# Patient Record
Sex: Female | Born: 1990
Health system: Southern US, Community
[De-identification: ages and names within clinical notes are randomized; demographics above are authoritative.]

## PROBLEM LIST (undated history)

## (undated) ENCOUNTER — Inpatient Hospital Stay: Payer: Self-pay

## (undated) ENCOUNTER — Inpatient Hospital Stay (HOSPITAL_COMMUNITY): Payer: Self-pay

## (undated) DIAGNOSIS — Z789 Other specified health status: Secondary | ICD-10-CM

## (undated) HISTORY — PX: NO PAST SURGERIES: SHX2092

## (undated) HISTORY — PX: WISDOM TOOTH EXTRACTION: SHX21

---
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
## 2011-06-09 ENCOUNTER — Emergency Department (HOSPITAL_BASED_OUTPATIENT_CLINIC_OR_DEPARTMENT_OTHER)
Admission: EM | Admit: 2011-06-09 | Discharge: 2011-06-09 | Disposition: A | Discharge status: Home / Self Care | Payer: Self-pay | Attending: Emergency Medicine | Admitting: Emergency Medicine

## 2011-06-09 ENCOUNTER — Encounter (HOSPITAL_BASED_OUTPATIENT_CLINIC_OR_DEPARTMENT_OTHER): Payer: Self-pay | Admitting: *Deleted

## 2011-06-09 DIAGNOSIS — K0889 Other specified disorders of teeth and supporting structures: Secondary | ICD-10-CM

## 2011-06-09 DIAGNOSIS — K089 Disorder of teeth and supporting structures, unspecified: Secondary | ICD-10-CM | POA: Insufficient documentation

## 2011-06-09 MED ORDER — PENICILLIN V POTASSIUM 500 MG PO TABS: 500.0000 mg | ORAL_TABLET | Freq: Four times a day (QID) | ORAL | Status: AC

## 2011-06-09 MED ORDER — HYDROCODONE-ACETAMINOPHEN 5-325 MG PO TABS: 2.0000 | ORAL_TABLET | ORAL | Status: AC | PRN

## 2011-06-09 NOTE — ED Notes (Signed)
C/o pain to left side of face and mouth x 79month-denies injury

## 2011-06-09 NOTE — ED Provider Notes (Signed)
History     CSN: [id number]  Arrival date & time 06/09/11  1204   First MD Initiated Contact with Patient 06/09/11 1348      Chief Complaint  Patient presents with  . Dental Pain  . Headache    (Consider location/radiation/quality/duration/timing/severity/associated sxs/prior treatment) Patient is a 21 y.o. female presenting with tooth pain. The history is provided by the patient. No language interpreter was used.  Dental PainThe symptoms began more than 1 month ago. The symptoms are worsening. The symptoms are new. The symptoms occur constantly.  Pt complains of pain in her mouth and in the left side of her face.  Pt thinks pain is from a tooth.  Pt has not seen her dentist.  Pt complains of pain in side of face from ear to neck.  History reviewed. No pertinent past medical history.  History reviewed. No pertinent past surgical history.  No family history on file.  History  Substance Use Topics  . Smoking status: Never Smoker   . Smokeless tobacco: Not on file  . Alcohol Use: No    OB History    Grav Para Term Preterm Abortions TAB SAB Ect Mult Living                  Review of Systems  HENT: Positive for dental problem.   All other systems reviewed and are negative.    Allergies  Review of patient's allergies indicates no known allergies.  Home Medications   Current Outpatient Rx  Name Route Sig Dispense Refill  . HYDROCODONE-ACETAMINOPHEN 5-325 MG PO TABS Oral Take 2 tablets by mouth every 4 (four) hours as needed for pain. 10 tablet 0  . PENICILLIN V POTASSIUM 500 MG PO TABS Oral Take 1 tablet (500 mg total) by mouth 4 (four) times daily. 40 tablet 0    BP 121/73  Pulse 95  Temp(Src) 98.1 F (36.7 C) (Oral)  Resp 16  Ht 5\' 5"  (1.651 m)  Wt 137 lb (62.143 kg)  BMI 22.80 kg/m2  SpO2 100%  Physical Exam  Nursing note and vitals reviewed. Constitutional: She is oriented to person, place, and time. She appears well-developed and well-nourished.    HENT:  Head: Normocephalic and atraumatic.  Eyes: Conjunctivae and EOM are normal. Pupils are equal, round, and reactive to light.  Neck: Normal range of motion. Neck supple.  Cardiovascular: Normal rate.   Pulmonary/Chest: Effort normal.  Abdominal: Soft.  Musculoskeletal: Normal range of motion.  Neurological: She is alert and oriented to person, place, and time.  Skin: Skin is warm.  Psychiatric: She has a normal mood and affect.    ED Course  Procedures (including critical care time)  Labs Reviewed - No data to display No results found.   1. Tooth pain       MDM  Pt counseled I will put on pcn.  I advised pt she needs to have dental xrays.  Pt advised to call her dentist to be seen for evaluationj    Medical screening examination/treatment/procedure(s) were performed by non-physician practitioner and as supervising physician I was immediately available for consultation/collaboration. Osvaldo Human, M.D.    Lonia Skinner [place], [place] 06/09/11 1843  Carleene Cooper III, MD 06/09/11 2117

## 2011-06-09 NOTE — Discharge Instructions (Signed)
Dental Pain A tooth ache may be caused by cavities (tooth decay). Cavities expose the nerve of the tooth to air and hot or cold temperatures. It may come from an infection or abscess (also called a boil or furuncle) around your tooth. It is also often caused by dental caries (tooth decay). This causes the pain you are having. DIAGNOSIS  Your caregiver can diagnose this problem by exam. TREATMENT   If caused by an infection, it may be treated with medications which kill germs (antibiotics) and pain medications as prescribed by your caregiver. Take medications as directed.   Only take over-the-counter or prescription medicines for pain, discomfort, or fever as directed by your caregiver.   Whether the tooth ache today is caused by infection or dental disease, you should see your dentist as soon as possible for further care.  SEEK MEDICAL CARE IF: The exam and treatment you received today has been provided on an emergency basis only. This is not a substitute for complete medical or dental care. If your problem worsens or new problems (symptoms) appear, and you are unable to meet with your dentist, call or return to this location. SEEK IMMEDIATE MEDICAL CARE IF:   You have a fever.   You develop redness and swelling of your face, jaw, or neck.   You are unable to open your mouth.   You have severe pain uncontrolled by pain medicine.  MAKE SURE YOU:   Understand these instructions.   Will watch your condition.   Will get help right away if you are not doing well or get worse.  Document Released: 03/16/2005 Document Revised: 03/05/2011 Document Reviewed: 11/02/2007 [facility] Patient Information 2012 [place], [place].Dental Pain A tooth ache may be caused by cavities (tooth decay). Cavities expose the nerve of the tooth to air and hot or cold temperatures. It may come from an infection or abscess (also called a boil or furuncle) around your tooth. It is also often caused by dental caries (tooth  decay). This causes the pain you are having. DIAGNOSIS  Your caregiver can diagnose this problem by exam. TREATMENT   If caused by an infection, it may be treated with medications which kill germs (antibiotics) and pain medications as prescribed by your caregiver. Take medications as directed.   Only take over-the-counter or prescription medicines for pain, discomfort, or fever as directed by your caregiver.   Whether the tooth ache today is caused by infection or dental disease, you should see your dentist as soon as possible for further care.  SEEK MEDICAL CARE IF: The exam and treatment you received today has been provided on an emergency basis only. This is not a substitute for complete medical or dental care. If your problem worsens or new problems (symptoms) appear, and you are unable to meet with your dentist, call or return to this location. SEEK IMMEDIATE MEDICAL CARE IF:   You have a fever.   You develop redness and swelling of your face, jaw, or neck.   You are unable to open your mouth.   You have severe pain uncontrolled by pain medicine.  MAKE SURE YOU:   Understand these instructions.   Will watch your condition.   Will get help right away if you are not doing well or get worse.  Document Released: 03/16/2005 Document Revised: 03/05/2011 Document Reviewed: 11/02/2007 [facility] Patient Information 2012 [place], [place].

## 2015-11-02 ENCOUNTER — Encounter (HOSPITAL_BASED_OUTPATIENT_CLINIC_OR_DEPARTMENT_OTHER): Payer: Self-pay | Admitting: *Deleted

## 2015-11-02 ENCOUNTER — Emergency Department (HOSPITAL_BASED_OUTPATIENT_CLINIC_OR_DEPARTMENT_OTHER)
Admission: EM | Admit: 2015-11-02 | Discharge: 2015-11-02 | Disposition: A | Discharge status: Home / Self Care | Payer: Medicaid Other | Attending: Emergency Medicine | Admitting: Emergency Medicine

## 2015-11-02 DIAGNOSIS — L03211 Cellulitis of face: Secondary | ICD-10-CM | POA: Diagnosis not present

## 2015-11-02 DIAGNOSIS — R220 Localized swelling, mass and lump, head: Secondary | ICD-10-CM | POA: Diagnosis present

## 2015-11-02 DIAGNOSIS — Z79899 Other long term (current) drug therapy: Secondary | ICD-10-CM | POA: Insufficient documentation

## 2015-11-02 MED ORDER — CLINDAMYCIN HCL 300 MG PO CAPS: 300.0000 mg | ORAL_CAPSULE | Freq: Four times a day (QID) | ORAL | 0 refills | Status: DC

## 2015-11-02 NOTE — ED Provider Notes (Signed)
[facility]   CSN: [id number] Arrival date & time: 11/02/15  0820  First Provider Contact:  First MD Initiated Contact with Patient 11/02/15 [phone]        History   Chief Complaint Chief Complaint  Patient presents with  . Oral Swelling    HPI Lorraine Stevens is a 25 y.o. female.  Patient presents with a 2 day history of swelling to her upper lip. She stated it started like a little stinging and she felt like there is a little bump there and through yesterday, increasingly swollen and tender. She denies any fevers. No tooth pain. No shortness of breath. No rash or itching. No history of abscesses in the past.      History reviewed. No pertinent past medical history.  There are no active problems to display for this patient.   History reviewed. No pertinent surgical history.  OB History    Gravida Para Term Preterm AB Living   1             SAB TAB Ectopic Multiple Live Births                   Home Medications    Prior to Admission medications   Medication Sig Start Date End Date Taking? Authorizing Provider  PRESCRIPTION MEDICATION Birth Control Pills   Yes Historical Provider, MD  clindamycin (CLEOCIN) 300 MG capsule Take 1 capsule (300 mg total) by mouth 4 (four) times daily. X 7 days 11/02/15   Rolan Bucco, MD    Family History No family history on file.  Social History Social History  Substance Use Topics  . Smoking status: Never Smoker  . Smokeless tobacco: Never Used  . Alcohol use No     Allergies   Review of patient's allergies indicates no known allergies.   Review of Systems Review of Systems  Constitutional: Negative for fever.  HENT: Positive for facial swelling.   Respiratory: Negative for shortness of breath.   Gastrointestinal: Negative for nausea and vomiting.  Skin: Negative for rash.  Allergic/Immunologic: Negative for environmental allergies and food allergies.  Neurological: Negative for  light-headedness.     Physical Exam Updated Vital Signs BP 129/81 (BP Location: Right Arm)   Pulse 71   Temp 99.2 F (37.3 C) (Oral)   Resp 18   Ht 5\' 6"  (1.676 m)   Wt 132 lb (59.9 kg)   LMP 10/16/2015   SpO2 99%   BMI 21.31 kg/m   Physical Exam  Constitutional: She is oriented to person, place, and time. She appears well-developed and well-nourished.  HENT:  Patient has a small less than 1 cm area of swelling to her upper lip. It is tender. There is no adjacent tooth tenderness or evident infection. I don't feel any fluctuant abscess. There is no surrounding facial swelling. No rash. No vesicles.  Cardiovascular: Normal rate.   Pulmonary/Chest: Effort normal.  Neurological: She is alert and oriented to person, place, and time.  Skin: Skin is warm and dry. No rash noted.  Vitals reviewed.    ED Treatments / Results  Labs (all labs ordered are listed, but only abnormal results are displayed) Labs Reviewed - No data to display  EKG  EKG Interpretation None       Radiology No results found.  Procedures Procedures (including critical care time)  Medications Ordered in ED Medications - No data to display   Initial Impression / Assessment and Plan / ED Course  I have reviewed the triage vital signs and the nursing notes.  Pertinent labs & imaging results that were available during my care of the patient were reviewed by me and considered in my medical decision making (see chart for details).  Clinical Course    Patient with a localized infection to her upper lip. I don't feel a drainable abscess. There is no evidence of allergic reaction or systemic signs of infection. She was discharged home in good condition. She was given a prescription for clindamycin. She was advised to use warm compresses to the area. She was advised return if she has any worsening symptoms or increased swelling.  Final Clinical Impressions(s) / ED Diagnoses   Final diagnoses:  Facial  cellulitis    New Prescriptions New Prescriptions   CLINDAMYCIN (CLEOCIN) 300 MG CAPSULE    Take 1 capsule (300 mg total) by mouth 4 (four) times daily. X 7 days     Rolan Bucco, MD 11/02/15 [phone]

## 2015-11-02 NOTE — ED Triage Notes (Signed)
Patient states she was watching on her cough watching tv two nights ago and had a sudden swelling of her upper lip.  Has taken benadryl yesterday with no relief.

## 2015-12-18 ENCOUNTER — Emergency Department (HOSPITAL_BASED_OUTPATIENT_CLINIC_OR_DEPARTMENT_OTHER)
Admission: EM | Admit: 2015-12-18 | Discharge: 2015-12-18 | Disposition: A | Discharge status: Home / Self Care | Payer: Medicaid Other | Attending: Emergency Medicine | Admitting: Emergency Medicine

## 2015-12-18 ENCOUNTER — Encounter (HOSPITAL_BASED_OUTPATIENT_CLINIC_OR_DEPARTMENT_OTHER): Payer: Self-pay

## 2015-12-18 DIAGNOSIS — Y92410 Unspecified street and highway as the place of occurrence of the external cause: Secondary | ICD-10-CM | POA: Diagnosis not present

## 2015-12-18 DIAGNOSIS — M791 Myalgia: Secondary | ICD-10-CM | POA: Diagnosis not present

## 2015-12-18 DIAGNOSIS — R51 Headache: Secondary | ICD-10-CM | POA: Diagnosis not present

## 2015-12-18 DIAGNOSIS — M7918 Myalgia, other site: Secondary | ICD-10-CM

## 2015-12-18 DIAGNOSIS — Y9389 Activity, other specified: Secondary | ICD-10-CM | POA: Insufficient documentation

## 2015-12-18 DIAGNOSIS — M542 Cervicalgia: Secondary | ICD-10-CM | POA: Diagnosis present

## 2015-12-18 DIAGNOSIS — Y999 Unspecified external cause status: Secondary | ICD-10-CM | POA: Diagnosis not present

## 2015-12-18 MED ORDER — METHOCARBAMOL 500 MG PO TABS: 1000.0000 mg | ORAL_TABLET | Freq: Four times a day (QID) | ORAL | 0 refills | Status: DC
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
## 2016-04-21 ENCOUNTER — Emergency Department (HOSPITAL_BASED_OUTPATIENT_CLINIC_OR_DEPARTMENT_OTHER): Payer: Medicaid Other

## 2016-04-21 ENCOUNTER — Encounter (HOSPITAL_BASED_OUTPATIENT_CLINIC_OR_DEPARTMENT_OTHER): Payer: Self-pay

## 2016-04-21 ENCOUNTER — Emergency Department (HOSPITAL_BASED_OUTPATIENT_CLINIC_OR_DEPARTMENT_OTHER)
Admission: EM | Admit: 2016-04-21 | Discharge: 2016-04-21 | Disposition: A | Discharge status: Home / Self Care | Payer: Medicaid Other | Attending: Emergency Medicine | Admitting: Emergency Medicine

## 2016-04-21 DIAGNOSIS — R509 Fever, unspecified: Secondary | ICD-10-CM | POA: Insufficient documentation

## 2016-04-21 DIAGNOSIS — R05 Cough: Secondary | ICD-10-CM | POA: Diagnosis not present

## 2016-04-21 DIAGNOSIS — J029 Acute pharyngitis, unspecified: Secondary | ICD-10-CM | POA: Diagnosis not present

## 2016-04-21 DIAGNOSIS — R6889 Other general symptoms and signs: Secondary | ICD-10-CM

## 2016-04-21 DIAGNOSIS — R110 Nausea: Secondary | ICD-10-CM | POA: Diagnosis not present

## 2016-04-21 DIAGNOSIS — R630 Anorexia: Secondary | ICD-10-CM | POA: Diagnosis not present

## 2016-04-21 DIAGNOSIS — R0981 Nasal congestion: Secondary | ICD-10-CM | POA: Diagnosis present

## 2016-04-21 MED ORDER — IBUPROFEN 800 MG PO TABS
800.0000 mg | ORAL_TABLET | Freq: Once | ORAL | Status: AC
  Administered 2016-04-21: 800 mg via ORAL
  Filled 2016-04-21: qty 1

## 2016-04-21 MED ORDER — ONDANSETRON HCL 4 MG PO TABS: 4.0000 mg | ORAL_TABLET | Freq: Four times a day (QID) | ORAL | 0 refills | Status: DC

## 2016-04-21 NOTE — ED Provider Notes (Signed)
[facility] Provider Note   CSN: [id number] Arrival date & time: 04/21/16  1839   By signing my name below, I, Soijett Blue, attest that this documentation has been prepared under the direction and in the presence of Audry Pili, PA-C Electronically Signed: Soijett Blue, ED Scribe. 04/21/16. 8:40 PM.  History   Chief Complaint Chief Complaint  Patient presents with  . Generalized Body Aches    HPI Lorraine Stevens is a 26 y.o. female who presents to the Emergency Department complaining of generalized body aches onset 3 days ago. She is having associated symptoms of sore throat, nasal congestion, fever, dry cough, nausea, and decreased appetite. She states that she has tried tylenol and advil with no relief for her symptoms. She denies vomiting, diarrhea, abdominal pain, and any other symptoms. Denies sick contacts. Denies medical issues or pregnancy at this time.   The history is provided by the patient. No language interpreter was used.    History reviewed. No pertinent past medical history.  There are no active problems to display for this patient.   History reviewed. No pertinent surgical history.  OB History    Gravida Para Term Preterm AB Living   1             SAB TAB Ectopic Multiple Live Births                   Home Medications    Prior to Admission medications   Not on File    Family History No family history on file.  Social History Social History  Substance Use Topics  . Smoking status: Never Smoker  . Smokeless tobacco: Never Used  . Alcohol use No     Allergies   Patient has no known allergies.   Review of Systems Review of Systems  Constitutional: Positive for appetite change and fever.  HENT: Positive for congestion and sore throat.   Respiratory: Positive for cough.   Gastrointestinal: Positive for nausea. Negative for abdominal pain, diarrhea and vomiting.  Musculoskeletal: Positive for myalgias.   A complete 10 system  review of systems was obtained and all systems are negative except as noted in the HPI and PMH.   Physical Exam Updated Vital Signs BP 130/81 (BP Location: Left Arm)   Pulse 96   Temp 102.7 F (39.3 C) (Oral)   Resp 18   LMP 04/14/2016   SpO2 98%   Physical Exam  Constitutional: She is oriented to person, place, and time. She appears well-developed and well-nourished. No distress.  HENT:  Head: Normocephalic and atraumatic.  Right Ear: Tympanic membrane, external ear and ear canal normal.  Left Ear: Tympanic membrane, external ear and ear canal normal.  Nose: Nose normal.  Mouth/Throat: Uvula is midline, oropharynx is clear and moist and mucous membranes are normal. No trismus in the jaw. No oropharyngeal exudate, posterior oropharyngeal erythema or tonsillar abscesses.  Eyes: EOM are normal. Pupils are equal, round, and reactive to light.  Neck: Normal range of motion. Neck supple. No tracheal deviation present.  Cardiovascular: Normal rate, regular rhythm, S1 normal, S2 normal, normal heart sounds, intact distal pulses and normal pulses.  Exam reveals no gallop and no friction rub.   No murmur heard. Pulmonary/Chest: Effort normal and breath sounds normal. No respiratory distress. She has no decreased breath sounds. She has no wheezes. She has no rhonchi. She has no rales.  Abdominal: Normal appearance and bowel sounds are normal. She exhibits no distension. There is no  tenderness.  Musculoskeletal: Normal range of motion.  Neurological: She is alert and oriented to person, place, and time.  Skin: Skin is warm and dry.  Psychiatric: She has a normal mood and affect. Her speech is normal and behavior is normal. Thought content normal.  Nursing note and vitals reviewed.  ED Treatments / Results  DIAGNOSTIC STUDIES: Oxygen Saturation is 98% on RA, nl by my interpretation.    COORDINATION OF CARE: 8:40 PM Discussed treatment plan with pt at bedside which includes CXR, ibuprofen,  and pt agreed to plan.   Radiology Dg Chest 2 View  Result Date: 04/21/2016 CLINICAL DATA:  Generalized body aches with lethargy and cough. Fever. EXAM: CHEST  2 VIEW COMPARISON:  None. FINDINGS: The heart size and mediastinal contours are within normal limits. Both lungs are clear. The visualized skeletal structures are unremarkable. IMPRESSION: No active cardiopulmonary disease. Electronically Signed   By: Kennith CenterEric  Mansell M.D.   On: 04/21/2016 19:55    Procedures Procedures (including critical care time)  Medications Ordered in ED Medications  ibuprofen (ADVIL,MOTRIN) tablet 800 mg (800 mg Oral Given 04/21/16 1912)     Initial Impression / Assessment and Plan / ED Course  I have reviewed the triage vital signs and the nursing notes.  Pertinent imaging results that were available during my care of the patient were reviewed by me and considered in my medical decision making (see chart for details).  I have reviewed and evaluated the relevant imaging studies. I have reviewed the relevant previous healthcare records. I obtained HPI from historian.  ED Course:  Assessment: Patient with symptoms consistent with influenza.  Vitals are stable, low-grade fever that responds to tylenol.  No signs of dehydration, tolerating PO's. Lungs are clear. CXR negative for acute infiltrate. Discussed the cost versus benefit of Tamiflu treatment with the patient. The patient understands that symptoms are greater than the recommended 24-48 hour window of treatment. Patient will be discharged with instructions to orally hydrate, rest, and use over-the-counter medications such as anti-inflammatories ibuprofen and Aleve for muscle aches and Tylenol for fever. Patient will also be given a cough suppressant.    Disposition/Plan:  DC home Additional Verbal discharge instructions given and discussed with patient.  Pt Instructed to f/u with PCP in the next week for evaluation and treatment of symptoms. Return  precautions given Pt acknowledges and agrees with plan  Supervising Physician Canary Brimhristopher J Tegeler, MD  Final Clinical Impressions(s) / ED Diagnoses   Final diagnoses:  Flu-like symptoms    New Prescriptions New Prescriptions   No medications on file    I personally performed the services described in this documentation, which was scribed in my presence. The recorded information has been reviewed and is accurate.    Audry Piliyler Teryn Boerema, PA-C 04/21/16 2047    Canary Brimhristopher J Tegeler, MD 04/22/16 [phone]

## 2016-04-21 NOTE — Discharge Instructions (Signed)
Please read and follow all provided instructions.  Your diagnoses today include:  1. Flu-like symptoms    Tests performed today include: Chest Xray Vital signs. See below for your results today.   Medications prescribed:   Take any prescribed medications only as directed.  Home care instructions:  Follow any educational materials contained in this packet. Please continue drinking plenty of fluids. Use over-the-counter cold and flu medications as needed as directed on packaging for symptom relief. You may also use ibuprofen or tylenol as directed on packaging for pain or fever.   BE VERY CAREFUL not to take multiple medicines containing Tylenol (also called acetaminophen). Doing so can lead to an overdose which can damage your liver and cause liver failure and possibly death.   Follow-up instructions: Please follow-up with your primary care provider in the next 3 days for further evaluation of your symptoms.   Return instructions:  Please return to the Emergency Department if you experience worsening symptoms. Please return if you have a high fever greater than 101 degrees not controlled with over-the-counter medications, persistent vomiting and cannot keep down fluids, or worsening trouble breathing. Please return if you have any other emergent concerns.  Additional Information:  Your vital signs today were: BP 130/81 (BP Location: Left Arm)    Pulse 96    Temp 102.7 F (39.3 C) (Oral)    Resp 18    LMP 04/14/2016    SpO2 98%  If your blood pressure (BP) was elevated above 135/85 this visit, please have this repeated by your doctor within one month.

## 2016-04-21 NOTE — ED Triage Notes (Signed)
C/o body aches, dry cough x 3 days-NAD-steady gait

## 2017-03-30 NOTE — L&D Delivery Note (Signed)
Pt had an amniotomy with clear fluid this am. She was started on pitocin. She progressed to 3 cm and then had a series of decels. She was felt to have tachysystole. The pit was stopped and she received terb. She then rapidly completed the first stage. She pushed for 20 min and developed deep decels with pushing. The VE was placed at +2 station and she then delivered one live viable black female infant over a second degree midline tear. Delivered with one ctx no pop off. Easy pull. Placenta -S/I. Heavy bleeding pp was txd with fundal massage and methergine. Baby to NBN. EBL- 500cc. Tear closed wit 3-0 Chromic.

## 2017-06-14 ENCOUNTER — Inpatient Hospital Stay (HOSPITAL_COMMUNITY)
Admission: AD | Admit: 2017-06-14 | Discharge: 2017-06-14 | Disposition: A | Discharge status: Home / Self Care | Payer: 59 | Source: Ambulatory Visit | Attending: Obstetrics and Gynecology | Admitting: Obstetrics and Gynecology

## 2017-06-14 ENCOUNTER — Encounter (HOSPITAL_COMMUNITY): Payer: Self-pay | Admitting: *Deleted

## 2017-06-14 DIAGNOSIS — O219 Vomiting of pregnancy, unspecified: Secondary | ICD-10-CM | POA: Diagnosis not present

## 2017-06-14 DIAGNOSIS — Z3A01 Less than 8 weeks gestation of pregnancy: Secondary | ICD-10-CM | POA: Insufficient documentation

## 2017-06-14 HISTORY — DX: Other specified health status: Z78.9

## 2017-06-14 LAB — URINALYSIS, ROUTINE W REFLEX MICROSCOPIC
Bilirubin Urine: NEGATIVE
Glucose, UA: NEGATIVE mg/dL
HGB URINE DIPSTICK: NEGATIVE
Ketones, ur: 80 mg/dL — AB
Leukocytes, UA: NEGATIVE
NITRITE: NEGATIVE
PROTEIN: 30 mg/dL — AB
SPECIFIC GRAVITY, URINE: 1.024 (ref 1.005–1.030)
pH: 6 (ref 5.0–8.0)

## 2017-06-14 LAB — POCT PREGNANCY, URINE: PREG TEST UR: POSITIVE — AB

## 2017-06-14 MED ORDER — PROMETHAZINE HCL 25 MG/ML IJ SOLN
25.0000 mg | Freq: Once | INTRAMUSCULAR | Status: AC
  Administered 2017-06-14: 25 mg via INTRAVENOUS
  Filled 2017-06-14: qty 1

## 2017-06-14 MED ORDER — LACTATED RINGERS IV BOLUS (SEPSIS)
1000.0000 mL | Freq: Once | INTRAVENOUS | Status: AC
  Administered 2017-06-14: 1000 mL via INTRAVENOUS

## 2017-06-14 MED ORDER — M.V.I. ADULT IV INJ
Freq: Once | INTRAVENOUS | Status: AC
  Administered 2017-06-14: 13:00:00 via INTRAVENOUS
  Filled 2017-06-14: qty 10

## 2017-06-14 MED ORDER — METOCLOPRAMIDE HCL 10 MG PO TABS: 10.0000 mg | ORAL_TABLET | Freq: Four times a day (QID) | ORAL | 0 refills | Status: DC

## 2017-06-14 MED ORDER — PROMETHAZINE HCL 25 MG PO TABS: 25.0000 mg | ORAL_TABLET | Freq: Four times a day (QID) | ORAL | 2 refills | Status: DC | PRN

## 2017-06-14 NOTE — MAU Note (Signed)
Pt reports she has been feeling nauseated and weak for  1 week. fetl dizzy like she is going to pass out.

## 2017-06-14 NOTE — MAU Provider Note (Signed)
History     CSN: [id number]  Arrival date and time: 06/14/17 4098   First Provider Initiated Contact with Patient 06/14/17 1118     Chief Complaint  Patient presents with  . Nausea  . Fatigue  . Dizziness   HPI Lorraine Stevens is a 27 y.o. G2P1001 at [redacted]w[redacted]d who presents with nausea and vomiting. She states for the last week she has had severe nausea and been unable to keep any food down. LMP 2/4 and has not taken a pregnancy test. She denies any pain, vaginal bleeding or abnormal discharge.   OB History    Gravida Para Term Preterm AB Living   2 1 1     1    SAB TAB Ectopic Multiple Live Births           1      Past Medical History:  Diagnosis Date  . Medical history non-contributory     Past Surgical History:  Procedure Laterality Date  . NO PAST SURGERIES      History reviewed. No pertinent family history.  Social History   Tobacco Use  . Smoking status: Never Smoker  . Smokeless tobacco: Never Used  Substance Use Topics  . Alcohol use: No  . Drug use: No    Allergies: No Known Allergies  Medications Prior to Admission  Medication Sig Dispense Refill Last Dose  . ondansetron (ZOFRAN) 4 MG tablet Take 1 tablet (4 mg total) by mouth every 6 (six) hours. 12 tablet 0     Review of Systems  Constitutional: Negative.  Negative for fatigue and fever.  HENT: Negative.   Respiratory: Negative.  Negative for shortness of breath.   Cardiovascular: Negative.  Negative for chest pain.  Gastrointestinal: Positive for nausea and vomiting. Negative for abdominal pain, constipation and diarrhea.  Genitourinary: Negative.  Negative for dysuria, vaginal bleeding and vaginal discharge.  Neurological: Negative.  Negative for dizziness and headaches.   Physical Exam   Blood pressure 109/66, pulse 87, temperature 99.2 F (37.3 C), temperature source Oral, resp. rate 18, height 5\' 6"  (1.676 m), weight 124 lb (56.2 kg), last menstrual period 05/03/2017, unknown if currently  breastfeeding.  Physical Exam  Nursing note and vitals reviewed. Constitutional: She is oriented to person, place, and time. She appears well-developed and well-nourished. No distress.  HENT:  Head: Normocephalic.  Eyes: Pupils are equal, round, and reactive to light.  Cardiovascular: Normal rate, regular rhythm and normal heart sounds.  Respiratory: Effort normal and breath sounds normal. No respiratory distress.  GI: Soft. Bowel sounds are normal. She exhibits no distension. There is no tenderness.  Neurological: She is alert and oriented to person, place, and time.  Skin: Skin is warm and dry.  Psychiatric: She has a normal mood and affect. Her behavior is normal. Judgment and thought content normal.    MAU Course  Procedures Results for orders placed or performed during the hospital encounter of 06/14/17 (from the past 24 hour(s))  Urinalysis, Routine w reflex microscopic     Status: Abnormal   Collection Time: 06/14/17 10:00 AM  Result Value Ref Range   Color, Urine YELLOW YELLOW   APPearance CLOUDY (A) CLEAR   Specific Gravity, Urine 1.024 1.005 - 1.030   pH 6.0 5.0 - 8.0   Glucose, UA NEGATIVE NEGATIVE mg/dL   Hgb urine dipstick NEGATIVE NEGATIVE   Bilirubin Urine NEGATIVE NEGATIVE   Ketones, ur 80 (A) NEGATIVE mg/dL   Protein, ur 30 (A) NEGATIVE mg/dL   Nitrite NEGATIVE  NEGATIVE   Leukocytes, UA NEGATIVE NEGATIVE   RBC / HPF 0-5 0 - 5 RBC/hpf   WBC, UA 0-5 0 - 5 WBC/hpf   Bacteria, UA RARE (A) NONE SEEN   Squamous Epithelial / LPF 6-30 (A) NONE SEEN   Mucus PRESENT   Pregnancy, urine POC     Status: Abnormal   Collection Time: 06/14/17 11:06 AM  Result Value Ref Range   Preg Test, Ur POSITIVE (A) NEGATIVE   MDM UA, UPT LR bolus Phenergan IV Multivitamin bolus  Assessment and Plan   1. Nausea and vomiting during pregnancy prior to [redacted] weeks gestation   2. [redacted] weeks gestation of pregnancy    -Discharge home in stable condition -Rx for phenergan and reglan  sent to patient's pharmacy -BRAT diet precautions discussed -Patient advised to follow-up with OB of choice to start prenatal care -Patient may return to MAU as needed or if her condition were to change or worsen   Rolm BookbinderCaroline M Shaniqwa Horsman CNM 06/14/2017, 11:27 AM

## 2017-06-14 NOTE — Discharge Instructions (Signed)
Safe Medications in Pregnancy   Acne: Benzoyl Peroxide Salicylic Acid  Backache/Headache: Tylenol: 2 regular strength every 4 hours OR              2 Extra strength every 6 hours  Colds/Coughs/Allergies: Benadryl (alcohol free) 25 mg every 6 hours as needed Breath right strips Claritin Cepacol throat lozenges Chloraseptic throat spray Cold-Eeze- up to three times per day Cough drops, alcohol free Flonase (by prescription only) Guaifenesin Mucinex Robitussin DM (plain only, alcohol free) Saline nasal spray/drops Sudafed (pseudoephedrine) & Actifed ** use only after [redacted] weeks gestation and if you do not have high blood pressure Tylenol Vicks Vaporub Zinc lozenges Zyrtec   Constipation: Colace Ducolax suppositories Fleet enema Glycerin suppositories Metamucil Milk of magnesia Miralax Senokot Smooth move tea  Diarrhea: Kaopectate Imodium A-D  *NO pepto Bismol  Hemorrhoids: Anusol Anusol HC Preparation H Tucks  Indigestion: Tums Maalox Mylanta Zantac  Pepcid  Insomnia: Benadryl (alcohol free) 25mg  every 6 hours as needed Tylenol PM Unisom, no Gelcaps  Leg Cramps: Tums MagGel  Nausea/Vomiting:  Bonine Dramamine Emetrol Ginger extract Sea bands Meclizine  Nausea medication to take during pregnancy:  Unisom (doxylamine succinate 25 mg tablets) Take one tablet daily at bedtime. If symptoms are not adequately controlled, the dose can be increased to a maximum recommended dose of two tablets daily (1/2 tablet in the morning, 1/2 tablet mid-afternoon and one at bedtime). Vitamin B6 100mg  tablets. Take one tablet twice a day (up to 200 mg per day).  Skin Rashes: Aveeno products Benadryl cream or 25mg  every 6 hours as needed Calamine Lotion 1% cortisone cream  Yeast infection: Gyne-lotrimin 7 Monistat 7   **If taking multiple medications, please check labels to avoid duplicating the same active ingredients **take medication as directed on  the label ** Do not exceed 4000 mg of tylenol in 24 hours **Do not take medications that contain aspirin or ibuprofen    [employer] [facility] [employer] [facility] [employer] at [facility]       Phone: [phone]  [facility] for [employer] at [employer] Phone: [phone]  [facility] for [employer] at [place] [place]  Phone: [phone]  [facility] for [employer] [facility] at [employer]  Phone: [phone]  [facility] [employer] [facility] at [place]  Phone: [phone]  [facility]       Phone: [phone]  [facility]    Phone: [phone]   [facility] [place])    Phone: [phone]  [facility]    Phone: [phone]  [facility]    Phone: [phone]  [facility]    Phone: [phone]  [facility]       Phone: [phone]   [facility]  Phone: [phone]  [facility]    Phone: [phone]  [facility]   Phone: [phone]  [facility]      Phone: [phone]  [facility]    Phone: [phone]    Morning Sickness Morning sickness is when you feel sick to your stomach (nauseous) during pregnancy. This nauseous feeling may or may not come with vomiting. It often occurs in the morning but can be a problem any time of day. Morning sickness is most common during the first trimester, but it may continue throughout pregnancy. While morning sickness is unpleasant, it is usually harmless unless you develop severe and continual vomiting (hyperemesis gravidarum). This condition requires  more intense treatment. What are the causes? The cause of morning sickness is not completely known but seems to be related to normal hormonal changes that occur in pregnancy. What increases the  risk? You are at greater risk if you:  Experienced nausea or vomiting before your pregnancy.  Had morning sickness during a previous pregnancy.  Are pregnant with more than one baby, such as twins.  How is this treated? Do not use any medicines (prescription, over-the-counter, or herbal) for morning sickness without first talking to your health care provider. Your health care provider may prescribe or recommend:  Vitamin B6 supplements.  Anti-nausea medicines.  The herbal medicine ginger.  Follow these instructions at home:  Only take over-the-counter or prescription medicines as directed by your health care provider.  Taking multivitamins before getting pregnant can prevent or decrease the severity of morning sickness in most women.  Eat a piece of dry toast or unsalted crackers before getting out of bed in the morning.  Eat five or six small meals a day.  Eat dry and bland foods (rice, baked potato). Foods high in carbohydrates are often helpful.  Do not drink liquids with your meals. Drink liquids between meals.  Avoid greasy, fatty, and spicy foods.  Get someone to cook for you if the smell of any food causes nausea and vomiting.  If you feel nauseous after taking prenatal vitamins, take the vitamins at night or with a snack.  Snack on protein foods (nuts, yogurt, cheese) between meals if you are hungry.  Eat unsweetened gelatins for desserts.  Wearing an acupressure wristband (worn for sea sickness) may be helpful.  Acupuncture may be helpful.  Do not smoke.  Get a humidifier to keep the air in your house free of odors.  Get plenty of fresh air. Contact a health care provider if:  Your home remedies are not working, and you need medicine.  You feel dizzy or lightheaded.  You are losing weight. Get help right away if:  You have persistent and uncontrolled nausea and vomiting.  You pass out (faint). This information is not intended to replace advice  given to you by your health care provider. Make sure you discuss any questions you have with your health care provider. Document Released: 05/07/2006 Document Revised: 08/22/2015 Document Reviewed: 08/31/2012 Elsevier Interactive Patient Education  2017 [employer]

## 2017-07-01 ENCOUNTER — Inpatient Hospital Stay (HOSPITAL_COMMUNITY)
Admission: AD | Admit: 2017-07-01 | Discharge: 2017-07-02 | Disposition: A | Discharge status: Home / Self Care | Payer: 59 | Source: Ambulatory Visit | Attending: Obstetrics & Gynecology | Admitting: Obstetrics & Gynecology

## 2017-07-01 ENCOUNTER — Encounter (HOSPITAL_COMMUNITY): Payer: Self-pay | Admitting: *Deleted

## 2017-07-01 DIAGNOSIS — Z3A08 8 weeks gestation of pregnancy: Secondary | ICD-10-CM | POA: Diagnosis not present

## 2017-07-01 DIAGNOSIS — O219 Vomiting of pregnancy, unspecified: Secondary | ICD-10-CM

## 2017-07-01 DIAGNOSIS — O210 Mild hyperemesis gravidarum: Secondary | ICD-10-CM | POA: Insufficient documentation

## 2017-07-01 DIAGNOSIS — O3680X0 Pregnancy with inconclusive fetal viability, not applicable or unspecified: Secondary | ICD-10-CM

## 2017-07-01 DIAGNOSIS — O26891 Other specified pregnancy related conditions, first trimester: Secondary | ICD-10-CM | POA: Diagnosis not present

## 2017-07-01 DIAGNOSIS — O283 Abnormal ultrasonic finding on antenatal screening of mother: Secondary | ICD-10-CM | POA: Diagnosis not present

## 2017-07-01 DIAGNOSIS — R102 Pelvic and perineal pain: Secondary | ICD-10-CM | POA: Diagnosis not present

## 2017-07-01 DIAGNOSIS — R1032 Left lower quadrant pain: Secondary | ICD-10-CM

## 2017-07-01 LAB — URINALYSIS, ROUTINE W REFLEX MICROSCOPIC
BACTERIA UA: NONE SEEN
BILIRUBIN URINE: NEGATIVE
Glucose, UA: NEGATIVE mg/dL
HGB URINE DIPSTICK: NEGATIVE
Ketones, ur: NEGATIVE mg/dL
LEUKOCYTES UA: NEGATIVE
NITRITE: NEGATIVE
PROTEIN: 30 mg/dL — AB
Specific Gravity, Urine: 1.024 (ref 1.005–1.030)
pH: 7 (ref 5.0–8.0)

## 2017-07-01 LAB — OB RESULTS CONSOLE GC/CHLAMYDIA: GC PROBE AMP, GENITAL: NEGATIVE

## 2017-07-01 MED ORDER — SODIUM CHLORIDE 0.9 % IV SOLN
25.0000 mg | Freq: Once | INTRAVENOUS | Status: DC
  Filled 2017-07-01: qty 1

## 2017-07-01 NOTE — MAU Provider Note (Signed)
Chief Complaint: Nausea and Emesis   First Provider Initiated Contact with Patient 07/01/17 2326        SUBJECTIVE HPI: Lorraine FreestoneKadesha Stevens is a 27 y.o. G2P1001 at 6290w3d by LMP who presents to maternity admissions reporting nausea and vomiting for 3 weeks.  Has not tried taking the Phenergan at home.  Also has cramping on LLQ for several weeks.  Has not had an US to r/o ectopic.. She denies vaginal bleeding, vaginal itching/burning, urinary symptoms, h/a, dizziness, or fever/chills.    Emesis   This is a recurrent problem. The current episode started 1 to 4 weeks ago. The problem has been unchanged. There has been no fever. Associated symptoms include abdominal pain. Pertinent negatives include no chills, coughing, diarrhea, dizziness, fever, headaches or myalgias. She has tried nothing for the symptoms.  Abdominal Pain  This is a recurrent problem. The current episode started 1 to 4 weeks ago. The onset quality is gradual. The problem occurs intermittently. The problem has been unchanged. The pain is located in the LLQ. The pain is mild. The quality of the pain is cramping. The abdominal pain does not radiate. Associated symptoms include vomiting. Pertinent negatives include no diarrhea, fever, headaches or myalgias. Nothing aggravates the pain. The pain is relieved by nothing. She has tried nothing for the symptoms.    RN note: PT SAYS N/V  STARTED 3 WEEKS - WAS HERE   FOR NAUSEA. - GAVE PHENERGAN .   -    HAS NOT TAKEN ANY.    FEELS  SOME CRAMPING    Past Medical History:  Diagnosis Date  . Medical history non-contributory    Past Surgical History:  Procedure Laterality Date  . NO PAST SURGERIES     Social History   Socioeconomic History  . Marital status: Single    Spouse name: Not on file  . Number of children: Not on file  . Years of education: Not on file  . Highest education level: Not on file  Occupational History  . Not on file  Social Needs  . Financial resource strain: Not  on file  . Food insecurity:    Worry: Not on file    Inability: Not on file  . Transportation needs:    Medical: Not on file    Non-medical: Not on file  Tobacco Use  . Smoking status: Never Smoker  . Smokeless tobacco: Never Used  Substance and Sexual Activity  . Alcohol use: No  . Drug use: No  . Sexual activity: Yes    Birth control/protection: None  Lifestyle  . Physical activity:    Days per week: Not on file    Minutes per session: Not on file  . Stress: Not on file  Relationships  . Social connections:    Talks on phone: Not on file    Gets together: Not on file    Attends religious service: Not on file    Active member of club or organization: Not on file    Attends meetings of clubs or organizations: Not on file    Relationship status: Not on file  . Intimate partner violence:    Fear of current or ex partner: Not on file    Emotionally abused: Not on file    Physically abused: Not on file    Forced sexual activity: Not on file  Other Topics Concern  . Not on file  Social History Narrative  . Not on file   No current facility-administered medications on  file prior to encounter.    Current Outpatient Medications on File Prior to Encounter  Medication Sig Dispense Refill  . doxylamine, Sleep, (UNISOM) 25 MG tablet Take 25 mg by mouth once.    . metoCLOPramide (REGLAN) 10 MG tablet Take 1 tablet (10 mg total) by mouth every 6 (six) hours. 30 tablet 0  . ondansetron (ZOFRAN) 4 MG tablet Take 1 tablet (4 mg total) by mouth every 6 (six) hours. (Patient not taking: Reported on 06/14/2017) 12 tablet 0  . promethazine (PHENERGAN) 25 MG tablet Take 1 tablet (25 mg total) by mouth every 6 (six) hours as needed for nausea or vomiting. 30 tablet 2  . pyridOXINE (VITAMIN B-6) 50 MG tablet Take 50 mg by mouth once.     No Known Allergies  I have reviewed patient's Past Medical Hx, Surgical Hx, Family Hx, Social Hx, medications and allergies.   ROS:  Review of Systems   Constitutional: Negative for chills and fever.  Respiratory: Negative for cough.   Gastrointestinal: Positive for abdominal pain and vomiting. Negative for diarrhea.  Musculoskeletal: Negative for myalgias.  Neurological: Negative for dizziness and headaches.   Review of Systems  Other systems negative   Physical Exam  Physical Exam Patient Vitals for the past 24 hrs:  BP Temp Temp src Pulse Resp Height Weight  07/01/17 2248 (!) 108/59 98.7 F (37.1 C) Oral 87 20 5\' 6"  (1.676 m) 129 lb 12 oz (58.9 kg)   Constitutional: Well-developed, female in no acute distress.  Cardiovascular: normal rate Respiratory: normal effort GI: Abd soft, non-tender. Pos BS x 4 MS: Extremities nontender, no edema, normal ROM Neurologic: Alert and oriented x 4.  GU: Neg CVAT.  PELVIC EXAM: Cervix pink, visually closed, without lesion, scant white creamy discharge, vaginal walls and external genitalia normal  LAB RESULTS Results for orders placed or performed during the hospital encounter of 07/01/17 (from the past 24 hour(s))  Urinalysis, Routine w reflex microscopic     Status: Abnormal   Collection Time: 07/01/17 10:50 PM  Result Value Ref Range   Color, Urine YELLOW YELLOW   APPearance HAZY (A) CLEAR   Specific Gravity, Urine 1.024 1.005 - 1.030   pH 7.0 5.0 - 8.0   Glucose, UA NEGATIVE NEGATIVE mg/dL   Hgb urine dipstick NEGATIVE NEGATIVE   Bilirubin Urine NEGATIVE NEGATIVE   Ketones, ur NEGATIVE NEGATIVE mg/dL   Protein, ur 30 (A) NEGATIVE mg/dL   Nitrite NEGATIVE NEGATIVE   Leukocytes, UA NEGATIVE NEGATIVE   RBC / HPF 0-5 0 - 5 RBC/hpf   WBC, UA 0-5 0 - 5 WBC/hpf   Bacteria, UA NONE SEEN NONE SEEN   Squamous Epithelial / LPF 6-30 (A) NONE SEEN   Mucus PRESENT   hCG, quantitative, pregnancy     Status: Abnormal   Collection Time: 07/02/17 12:42 AM  Result Value Ref Range   hCG, Beta Chain, Quant, S 161,096 (H) <5 mIU/mL  CBC     Status: Abnormal   Collection Time: 07/02/17 12:42 AM   Result Value Ref Range   WBC 7.9 4.0 - 10.5 K/uL   RBC 3.39 (L) 3.87 - 5.11 MIL/uL   Hemoglobin 10.8 (L) 12.0 - 15.0 g/dL   HCT 04.5 (L) 40.9 - 81.1 %   MCV 90.3 78.0 - 100.0 fL   MCH 31.9 26.0 - 34.0 pg   MCHC 35.3 30.0 - 36.0 g/dL   RDW 91.4 78.2 - 95.6 %   Platelets 193 150 - 400 K/uL  IMAGING US Ob Comp Less 14 Wks  Result Date: 07/02/2017 CLINICAL DATA:  Cramping. Estimated gestational age by LMP is 8 weeks 4 days. Quantitative beta HCG is pending EXAM: OBSTETRIC <14 WK ULTRASOUND TECHNIQUE: Transabdominal ultrasound was performed for evaluation of the gestation as well as the maternal uterus and adnexal regions. COMPARISON:  None. FINDINGS: Intrauterine gestational sac: A single intrauterine gestational sac is identified. Yolk sac:  Yolk sac is present. Embryo:  Fetal pole is present. Cardiac Activity: Fetal cardiac activity is observed. Heart Rate: 159 bpm CRL: 19.8 mm   8 w 3 d                  [place] EDC: 02/08/2018 Subchorionic hemorrhage:  None visualized. Maternal uterus/adnexae: Uterus is anteverted. No myometrial masses are identified. Both ovaries are visualized and appear normal. No abnormal adnexal masses are seen. No free fluid in the pelvis. IMPRESSION: A single intrauterine pregnancy is identified. Estimated gestational age by crown-rump length is 8 weeks 3 days. No acute complication is identified sonographically. Electronically Signed   By: Burman Nieves M.D.   On: 07/02/2017 01:47     MAU Management/MDM: Ordered usual first trimester r/o ectopic labs.   Pelvic exam and cultures done Will check baseline Ultrasound to rule out ectopic. US showed viable single IUP with heartbeat.    Treatments in MAU included Phenergan for nausea.. Refused IV fluids  This bleeding/pain can represent a normal pregnancy with bleeding, spontaneous abortion or even an ectopic which can be life-threatening.  The process as listed above helps to determine which of these is present.  Was  able to keep down fluids after Phenergan.  Again declined IV fluids.    ASSESSMENT Live single intrauterine pregnancy at [redacted]w[redacted]d Nausea and vomiting of pregnancy  PLAN Discharge home Results reviewed.   Encouraged to seek prenatal care, states she will do that soon Has Rxes for Phenergan, Reglan and Zofran but has not filled them  Pt stable at time of discharge. Encouraged to return here or to other Urgent Care/ED if she develops worsening of symptoms, increase in pain, fever, or other concerning symptoms.    Wynelle Bourgeois CNM, MSN Certified Nurse-Midwife 07/01/2017  11:27 PM

## 2017-07-01 NOTE — MAU Note (Signed)
PT SAYS N/V  STARTED 3 WEEKS - WAS HERE   FOR NAUSEA. - GAVE PHENERGAN .   -    HAS NOT TAKEN ANY.    FEELS  SOME CRAMPING.

## 2017-07-02 ENCOUNTER — Inpatient Hospital Stay (HOSPITAL_COMMUNITY): Payer: 59

## 2017-07-02 DIAGNOSIS — O210 Mild hyperemesis gravidarum: Secondary | ICD-10-CM | POA: Diagnosis not present

## 2017-07-02 DIAGNOSIS — R102 Pelvic and perineal pain: Secondary | ICD-10-CM | POA: Diagnosis not present

## 2017-07-02 DIAGNOSIS — Z3A08 8 weeks gestation of pregnancy: Secondary | ICD-10-CM | POA: Diagnosis not present

## 2017-07-02 DIAGNOSIS — O26891 Other specified pregnancy related conditions, first trimester: Secondary | ICD-10-CM | POA: Diagnosis not present

## 2017-07-02 DIAGNOSIS — O283 Abnormal ultrasonic finding on antenatal screening of mother: Secondary | ICD-10-CM | POA: Diagnosis not present

## 2017-07-02 LAB — CBC
HCT: 30.6 % — ABNORMAL LOW (ref 36.0–46.0)
HEMOGLOBIN: 10.8 g/dL — AB (ref 12.0–15.0)
MCH: 31.9 pg (ref 26.0–34.0)
MCHC: 35.3 g/dL (ref 30.0–36.0)
MCV: 90.3 fL (ref 78.0–100.0)
Platelets: 193 10*3/uL (ref 150–400)
RBC: 3.39 MIL/uL — ABNORMAL LOW (ref 3.87–5.11)
RDW: 12.8 % (ref 11.5–15.5)
WBC: 7.9 10*3/uL (ref 4.0–10.5)

## 2017-07-02 LAB — WET PREP, GENITAL
Clue Cells Wet Prep HPF POC: NONE SEEN
Sperm: NONE SEEN
TRICH WET PREP: NONE SEEN

## 2017-07-02 LAB — HCG, QUANTITATIVE, PREGNANCY: hCG, Beta Chain, Quant, S: 288388 m[IU]/mL — ABNORMAL HIGH (ref <5)

## 2017-07-02 LAB — HIV ANTIBODY (ROUTINE TESTING W REFLEX): HIV Screen 4th Generation wRfx: NONREACTIVE

## 2017-07-02 MED ORDER — PROMETHAZINE HCL 25 MG PO TABS
25.0000 mg | ORAL_TABLET | Freq: Once | ORAL | Status: AC
  Administered 2017-07-02: 25 mg via ORAL
  Filled 2017-07-02: qty 1

## 2017-07-02 NOTE — Discharge Instructions (Signed)
First Trimester of Pregnancy The first trimester of pregnancy is from week 1 until the end of week 13 (months 1 through 3). During this time, your baby will begin to develop inside you. At 6-8 weeks, the eyes and face are formed, and the heartbeat can be seen on ultrasound. At the end of 12 weeks, all the baby's organs are formed. Prenatal care is all the medical care you receive before the birth of your baby. Make sure you get good prenatal care and follow all of your doctor's instructions. Follow these instructions at home: Medicines  Take over-the-counter and prescription medicines only as told by your doctor. Some medicines are safe and some medicines are not safe during pregnancy.  Take a prenatal vitamin that contains at least 600 micrograms (mcg) of folic acid.  If you have trouble pooping (constipation), take medicine that will make your stool soft (stool softener) if your doctor approves. Eating and drinking  Eat regular, healthy meals.  Your doctor will tell you the amount of weight gain that is right for you.  Avoid raw meat and uncooked cheese.  If you feel sick to your stomach (nauseous) or throw up (vomit): ? Eat 4 or 5 small meals a day instead of 3 large meals. ? Try eating a few soda crackers. ? Drink liquids between meals instead of during meals.  To prevent constipation: ? Eat foods that are high in fiber, like fresh fruits and vegetables, whole grains, and beans. ? Drink enough fluids to keep your pee (urine) clear or pale yellow. Activity  Exercise only as told by your doctor. Stop exercising if you have cramps or pain in your lower belly (abdomen) or low back.  Do not exercise if it is too hot, too humid, or if you are in a place of great height (high altitude).  Try to avoid standing for long periods of time. Move your legs often if you must stand in one place for a long time.  Avoid heavy lifting.  Wear low-heeled shoes. Sit and stand up straight.  You  can have sex unless your doctor tells you not to. Relieving pain and discomfort  Wear a good support bra if your breasts are sore.  Take warm water baths (sitz baths) to soothe pain or discomfort caused by hemorrhoids. Use hemorrhoid cream if your doctor says it is okay.  Rest with your legs raised if you have leg cramps or low back pain.  If you have puffy, bulging veins (varicose veins) in your legs: ? Wear support hose or compression stockings as told by your doctor. ? Raise (elevate) your feet for 15 minutes, 3-4 times a day. ? Limit salt in your food. Prenatal care  Schedule your prenatal visits by the twelfth week of pregnancy.  Write down your questions. Take them to your prenatal visits.  Keep all your prenatal visits as told by your doctor. This is important. Safety  Wear your seat belt at all times when driving.  Make a list of emergency phone numbers. The list should include numbers for family, friends, the hospital, and police and fire departments. General instructions  Ask your doctor for a referral to a local prenatal class. Begin classes no later than at the start of month 6 of your pregnancy.  Ask for help if you need counseling or if you need help with nutrition. Your doctor can give you advice or tell you where to go for help.  Do not use hot tubs, steam rooms, or   saunas.  Do not douche or use tampons or scented sanitary pads.  Do not cross your legs for long periods of time.  Avoid all herbs and alcohol. Avoid drugs that are not approved by your doctor.  Do not use any tobacco products, including cigarettes, chewing tobacco, and electronic cigarettes. If you need help quitting, ask your doctor. You may get counseling or other support to help you quit.  Avoid cat litter boxes and soil used by cats. These carry germs that can cause birth defects in the baby and can cause a loss of your baby (miscarriage) or stillbirth.  Visit your dentist. At home, brush  your teeth with a soft toothbrush. Be gentle when you floss. Contact a doctor if:  You are dizzy.  You have mild cramps or pressure in your lower belly.  You have a nagging pain in your belly area.  You continue to feel sick to your stomach, you throw up, or you have watery poop (diarrhea).  You have a bad smelling fluid coming from your vagina.  You have pain when you pee (urinate).  You have increased puffiness (swelling) in your face, hands, legs, or ankles. Get help right away if:  You have a fever.  You are leaking fluid from your vagina.  You have spotting or bleeding from your vagina.  You have very bad belly cramping or pain.  You gain or lose weight rapidly.  You throw up blood. It may look like coffee grounds.  You are around people who have German measles, fifth disease, or chickenpox.  You have a very bad headache.  You have shortness of breath.  You have any kind of trauma, such as from a fall or a car accident. Summary  The first trimester of pregnancy is from week 1 until the end of week 13 (months 1 through 3).  To take care of yourself and your unborn baby, you will need to eat healthy meals, take medicines only if your doctor tells you to do so, and do activities that are safe for you and your baby.  Keep all follow-up visits as told by your doctor. This is important as your doctor will have to ensure that your baby is healthy and growing well. This information is not intended to replace advice given to you by your health care provider. Make sure you discuss any questions you have with your health care provider. Document Released: 09/02/2007 Document Revised: 03/24/2016 Document Reviewed: 03/24/2016 Elsevier Interactive Patient Education  2017 [employer]  Morning Sickness Morning sickness is when you feel sick to your stomach (nauseous) during pregnancy. This nauseous feeling may or may not come with vomiting. It often occurs in the morning but  can be a problem any time of day. Morning sickness is most common during the first trimester, but it may continue throughout pregnancy. While morning sickness is unpleasant, it is usually harmless unless you develop severe and continual vomiting (hyperemesis gravidarum). This condition requires more intense treatment. What are the causes? The cause of morning sickness is not completely known but seems to be related to normal hormonal changes that occur in pregnancy. What increases the risk? You are at greater risk if you:  Experienced nausea or vomiting before your pregnancy.  Had morning sickness during a previous pregnancy.  Are pregnant with more than one baby, such as twins.  How is this treated? Do not use any medicines (prescription, over-the-counter, or herbal) for morning sickness without first talking to your health care   provider. Your health care provider may prescribe or recommend:  Vitamin B6 supplements.  Anti-nausea medicines.  The herbal medicine ginger.  Follow these instructions at home:  Only take over-the-counter or prescription medicines as directed by your health care provider.  Taking multivitamins before getting pregnant can prevent or decrease the severity of morning sickness in most women.  Eat a piece of dry toast or unsalted crackers before getting out of bed in the morning.  Eat five or six small meals a day.  Eat dry and bland foods (rice, baked potato). Foods high in carbohydrates are often helpful.  Do not drink liquids with your meals. Drink liquids between meals.  Avoid greasy, fatty, and spicy foods.  Get someone to cook for you if the smell of any food causes nausea and vomiting.  If you feel nauseous after taking prenatal vitamins, take the vitamins at night or with a snack.  Snack on protein foods (nuts, yogurt, cheese) between meals if you are hungry.  Eat unsweetened gelatins for desserts.  Wearing an acupressure wristband (worn for  sea sickness) may be helpful.  Acupuncture may be helpful.  Do not smoke.  Get a humidifier to keep the air in your house free of odors.  Get plenty of fresh air. Contact a health care provider if:  Your home remedies are not working, and you need medicine.  You feel dizzy or lightheaded.  You are losing weight. Get help right away if:  You have persistent and uncontrolled nausea and vomiting.  You pass out (faint). This information is not intended to replace advice given to you by your health care provider. Make sure you discuss any questions you have with your health care provider. Document Released: 05/07/2006 Document Revised: 08/22/2015 Document Reviewed: 08/31/2012 Elsevier Interactive Patient Education  2017 Elsevier Inc.  

## 2017-07-05 LAB — GC/CHLAMYDIA PROBE AMP (~~LOC~~) NOT AT ARMC
CHLAMYDIA, DNA PROBE: NEGATIVE
Neisseria Gonorrhea: NEGATIVE

## 2017-07-14 ENCOUNTER — Encounter: Payer: Self-pay | Admitting: Emergency Medicine

## 2017-07-14 ENCOUNTER — Emergency Department
Admission: EM | Admit: 2017-07-14 | Discharge: 2017-07-14 | Disposition: A | Discharge status: Home / Self Care | Payer: 59 | Attending: Emergency Medicine | Admitting: Emergency Medicine

## 2017-07-14 DIAGNOSIS — O9989 Other specified diseases and conditions complicating pregnancy, childbirth and the puerperium: Secondary | ICD-10-CM | POA: Insufficient documentation

## 2017-07-14 DIAGNOSIS — Z3A10 10 weeks gestation of pregnancy: Secondary | ICD-10-CM | POA: Diagnosis not present

## 2017-07-14 DIAGNOSIS — R55 Syncope and collapse: Secondary | ICD-10-CM | POA: Insufficient documentation

## 2017-07-14 DIAGNOSIS — R42 Dizziness and giddiness: Secondary | ICD-10-CM | POA: Diagnosis not present

## 2017-07-14 LAB — CBC WITH DIFFERENTIAL/PLATELET
BASOS PCT: 0 %
Basophils Absolute: 0 10*3/uL (ref 0–0.1)
Eosinophils Absolute: 0.1 10*3/uL (ref 0–0.7)
Eosinophils Relative: 1 %
HEMATOCRIT: 33.4 % — AB (ref 35.0–47.0)
HEMOGLOBIN: 11.8 g/dL — AB (ref 12.0–16.0)
LYMPHS ABS: 1.6 10*3/uL (ref 1.0–3.6)
LYMPHS PCT: 25 %
MCH: 33.3 pg (ref 26.0–34.0)
MCHC: 35.5 g/dL (ref 32.0–36.0)
MCV: 93.9 fL (ref 80.0–100.0)
MONOS PCT: 6 %
Monocytes Absolute: 0.4 10*3/uL (ref 0.2–0.9)
NEUTROS ABS: 4.2 10*3/uL (ref 1.4–6.5)
NEUTROS PCT: 68 %
Platelets: 191 10*3/uL (ref 150–440)
RBC: 3.55 MIL/uL — ABNORMAL LOW (ref 3.80–5.20)
RDW: 13.4 % (ref 11.5–14.5)
WBC: 6.2 10*3/uL (ref 3.6–11.0)

## 2017-07-14 LAB — COMPREHENSIVE METABOLIC PANEL
ALBUMIN: 4 g/dL (ref 3.5–5.0)
ALK PHOS: 39 U/L (ref 38–126)
ALT: 9 U/L — ABNORMAL LOW (ref 14–54)
ANION GAP: 6 (ref 5–15)
AST: 21 U/L (ref 15–41)
BUN: 11 mg/dL (ref 6–20)
CALCIUM: 9.2 mg/dL (ref 8.9–10.3)
CO2: 23 mmol/L (ref 22–32)
Chloride: 107 mmol/L (ref 101–111)
Creatinine, Ser: 0.67 mg/dL (ref 0.44–1.00)
GFR calc Af Amer: >60 mL/min (ref >60)
GFR calc non Af Amer: >60 mL/min (ref >60)
GLUCOSE: 90 mg/dL (ref 65–99)
Potassium: 3.4 mmol/L — ABNORMAL LOW (ref 3.5–5.1)
Sodium: 136 mmol/L (ref 135–145)
TOTAL PROTEIN: 7.4 g/dL (ref 6.5–8.1)
Total Bilirubin: 0.6 mg/dL (ref 0.3–1.2)

## 2017-07-14 LAB — URINALYSIS, COMPLETE (UACMP) WITH MICROSCOPIC
Bacteria, UA: NONE SEEN
Bilirubin Urine: NEGATIVE
GLUCOSE, UA: NEGATIVE mg/dL
Hgb urine dipstick: NEGATIVE
Ketones, ur: NEGATIVE mg/dL
LEUKOCYTES UA: NEGATIVE
Nitrite: NEGATIVE
PROTEIN: 30 mg/dL — AB
Specific Gravity, Urine: 1.02 (ref 1.005–1.030)
pH: 8 (ref 5.0–8.0)

## 2017-07-14 LAB — GLUCOSE, CAPILLARY: Glucose-Capillary: 99 mg/dL (ref 65–99)

## 2017-07-14 LAB — HCG, QUANTITATIVE, PREGNANCY: hCG, Beta Chain, Quant, S: 315623 m[IU]/mL — ABNORMAL HIGH (ref <5)

## 2017-07-14 MED ORDER — DOXYLAMINE-PYRIDOXINE 10-10 MG PO TBEC: 1.0000 | DELAYED_RELEASE_TABLET | Freq: Two times a day (BID) | ORAL | 2 refills | Status: DC

## 2017-07-14 MED ORDER — SODIUM CHLORIDE 0.9 % IV BOLUS
1000.0000 mL | Freq: Once | INTRAVENOUS | Status: AC
Start: 2017-07-14 — End: 2017-07-14
  Administered 2017-07-14: 1000 mL via INTRAVENOUS

## 2017-07-14 NOTE — ED Notes (Signed)
Ambulated pt up and down hallway without difficulty. Pt state she is feeling good. Denies dizziness. Was eating snack at bedside. Dr. Mayford KnifeWilliams at bedside.

## 2017-07-14 NOTE — ED Triage Notes (Signed)
Pt was in department working when she felt lightheaded and tried to get to a chair but was unable to before she had a syncopal episode. Upon this nurses arrival room pt alert and oriented x 4 and in NAD. Pt denies any pain.

## 2017-07-14 NOTE — ED Provider Notes (Signed)
[facility] Emergency Department Provider Note       Time seen: ----------------------------------------- 8:09 AM on 07/14/2017 -----------------------------------------   I have reviewed the triage vital signs and the nursing notes.  HISTORY   Chief Complaint Loss of Consciousness    HPI Lorraine Stevens is a 27 y.o. female with no past medical history who presents to the ED for lightheadedness and near syncope.  Patient states she was working here when she began to feel lightheaded and tried to get into a chair but was unable to before she actually passed out.  She is never had this happen before, nothing makes it better or worse.  She currently is [redacted] weeks pregnant.  She denies vaginal bleeding, recent illness or other complications.  Patient states she did in fact eat breakfast.  Past Medical History:  Diagnosis Date  . Medical history non-contributory     There are no active problems to display for this patient.   Past Surgical History:  Procedure Laterality Date  . NO PAST SURGERIES      Allergies Patient has no known allergies.  Social History Social History   Tobacco Use  . Smoking status: Never Smoker  . Smokeless tobacco: Never Used  Substance Use Topics  . Alcohol use: No  . Drug use: No   Review of Systems Constitutional: Negative for fever. Cardiovascular: Negative for chest pain. Respiratory: Negative for shortness of breath. Gastrointestinal: Negative for abdominal pain, vomiting and diarrhea. Musculoskeletal: Negative for back pain. Skin: Negative for rash. Neurological: Negative for headaches, focal weakness or numbness.  All systems negative/normal/unremarkable except as stated in the HPI  ____________________________________________   PHYSICAL EXAM:  VITAL SIGNS: ED Triage Vitals  Enc Vitals Group     BP 07/14/17 0806 (!) 89/58     Pulse Rate 07/14/17 0806 87     Resp 07/14/17 0806 (!) 22     Temp 07/14/17  0806 (!) 97.5 F (36.4 C)     Temp Source 07/14/17 0806 Oral     SpO2 07/14/17 0806 100 %     Weight 07/14/17 0808 129 lb (58.5 kg)     Height 07/14/17 0808 5\' 6"  (1.676 m)     Head Circumference --      Peak Flow --      Pain Score 07/14/17 0808 0     Pain Loc --      Pain Edu? --      Excl. in GC? --    Constitutional: Alert and oriented. Well appearing and in no distress. Eyes: Conjunctivae are normal. Normal extraocular movements. Cardiovascular: Normal rate, regular rhythm. No murmurs, rubs, or gallops. Respiratory: Normal respiratory effort without tachypnea nor retractions. Breath sounds are clear and equal bilaterally. No wheezes/rales/rhonchi. Gastrointestinal: Soft and nontender. Normal bowel sounds Musculoskeletal: Nontender with normal range of motion in extremities. No lower extremity tenderness nor edema. Neurologic:  Normal speech and language. No gross focal neurologic deficits are appreciated.  Skin:  Skin is warm, dry and intact. No rash noted. Psychiatric: Mood and affect are normal. Speech and behavior are normal.  ____________________________________________  EKG: Interpreted by me.  Sinus rhythm rate 81 bpm, normal PR interval, normal QRS, normal QT.  ____________________________________________  ED COURSE:  As part of my medical decision making, I reviewed the following data within the electronic MEDICAL RECORD NUMBER History obtained from family if available, nursing notes, old chart and ekg, as well as notes from prior ED visits. Patient presented for near syncope, we  will assess with labs and imaging as indicated at this time.   Procedures ____________________________________________   LABS (pertinent positives/negatives)  Labs Reviewed  HCG, QUANTITATIVE, PREGNANCY - Abnormal; Notable for the following components:      Result Value   hCG, Beta Chain, [place]alia LongestQuant, S [id number] (*)    All other components within normal limits  CBC WITH DIFFERENTIAL/PLATELET -  Abnormal; Notable for the following components:   RBC 3.55 (*)    Hemoglobin 11.8 (*)    HCT 33.4 (*)    All other components within normal limits  COMPREHENSIVE METABOLIC PANEL - Abnormal; Notable for the following components:   Potassium 3.4 (*)    ALT 9 (*)    All other components within normal limits  URINALYSIS, COMPLETE (UACMP) WITH MICROSCOPIC - Abnormal; Notable for the following components:   Color, Urine YELLOW (*)    APPearance CLOUDY (*)    Protein, ur 30 (*)    Squamous Epithelial / LPF TOO NUMEROUS TO COUNT (*)    All other components within normal limits  GLUCOSE, CAPILLARY   ____________________________________________  DIFFERENTIAL DIAGNOSIS   Dehydration, electrolyte abnormality, physiologic changes of pregnancy  FINAL ASSESSMENT AND PLAN  Syncope   Plan: The patient had presented for syncope. Patient's labs are unremarkable.  She has not had any pregnancy related complaints, I will place her on antiemetics and encourage increase oral intake.  She is cleared for outpatient follow-up and is ambulated here without any near syncope.   Ulice DashJohnathan E Trenell Moxey, MD   Note: This note was generated in part or whole with voice recognition software. Voice recognition is usually quite accurate but there are transcription errors that can and very often do occur. I apologize for any typographical errors that were not detected and corrected.     Emily FilbertWilliams, Sutton Hirsch E, MD 07/14/17 1201

## 2017-08-04 DIAGNOSIS — Z3480 Encounter for supervision of other normal pregnancy, unspecified trimester: Secondary | ICD-10-CM | POA: Diagnosis not present

## 2017-08-04 DIAGNOSIS — O3680X9 Pregnancy with inconclusive fetal viability, other fetus: Secondary | ICD-10-CM | POA: Diagnosis not present

## 2017-08-04 LAB — OB RESULTS CONSOLE ABO/RH: RH Type: POSITIVE

## 2017-08-04 LAB — OB RESULTS CONSOLE RUBELLA ANTIBODY, IGM: Rubella: IMMUNE

## 2017-08-04 LAB — OB RESULTS CONSOLE HEPATITIS B SURFACE ANTIGEN: HEP B S AG: NEGATIVE

## 2017-08-04 LAB — OB RESULTS CONSOLE ANTIBODY SCREEN: ANTIBODY SCREEN: NEGATIVE

## 2017-08-04 LAB — OB RESULTS CONSOLE HIV ANTIBODY (ROUTINE TESTING): HIV: NONREACTIVE

## 2017-08-04 LAB — OB RESULTS CONSOLE RPR: RPR: NONREACTIVE

## 2017-08-09 ENCOUNTER — Encounter (HOSPITAL_COMMUNITY): Payer: Self-pay

## 2017-08-09 ENCOUNTER — Other Ambulatory Visit: Payer: Self-pay

## 2017-08-09 ENCOUNTER — Inpatient Hospital Stay (HOSPITAL_COMMUNITY)
Admission: AD | Admit: 2017-08-09 | Discharge: 2017-08-09 | Disposition: A | Discharge status: Home / Self Care | Payer: 59 | Source: Ambulatory Visit | Attending: Obstetrics | Admitting: Obstetrics

## 2017-08-09 DIAGNOSIS — R102 Pelvic and perineal pain: Secondary | ICD-10-CM | POA: Diagnosis not present

## 2017-08-09 DIAGNOSIS — Z3A14 14 weeks gestation of pregnancy: Secondary | ICD-10-CM | POA: Diagnosis not present

## 2017-08-09 DIAGNOSIS — O26892 Other specified pregnancy related conditions, second trimester: Secondary | ICD-10-CM

## 2017-08-09 LAB — URINALYSIS, ROUTINE W REFLEX MICROSCOPIC
BILIRUBIN URINE: NEGATIVE
Glucose, UA: NEGATIVE mg/dL
HGB URINE DIPSTICK: NEGATIVE
Ketones, ur: NEGATIVE mg/dL
Leukocytes, UA: NEGATIVE
Nitrite: NEGATIVE
Protein, ur: NEGATIVE mg/dL
Specific Gravity, Urine: 1.02 (ref 1.005–1.030)
pH: 5 (ref 5.0–8.0)

## 2017-08-09 LAB — WET PREP, GENITAL
Clue Cells Wet Prep HPF POC: NONE SEEN
SPERM: NONE SEEN
Trich, Wet Prep: NONE SEEN
Yeast Wet Prep HPF POC: NONE SEEN

## 2017-08-09 MED ORDER — ACETAMINOPHEN 500 MG PO TABS
1000.0000 mg | ORAL_TABLET | Freq: Four times a day (QID) | ORAL | Status: DC | PRN
  Administered 2017-08-09: 1000 mg via ORAL
  Filled 2017-08-09: qty 2

## 2017-08-09 NOTE — Discharge Instructions (Signed)
Second Trimester of Pregnancy The second trimester is from week 13 through week 28, month 4 through 6. This is often the time in pregnancy that you feel your best. Often times, morning sickness has lessened or quit. You may have more energy, and you may get hungry more often. Your unborn baby (fetus) is growing rapidly. At the end of the sixth month, he or she is about 9 inches long and weighs about 1 pounds. You will likely feel the baby move (quickening) between 18 and 20 weeks of pregnancy. Follow these instructions at home:  Avoid all smoking, herbs, and alcohol. Avoid drugs not approved by your doctor.  Do not use any tobacco products, including cigarettes, chewing tobacco, and electronic cigarettes. If you need help quitting, ask your doctor. You may get counseling or other support to help you quit.  Only take medicine as told by your doctor. Some medicines are safe and some are not during pregnancy.  Exercise only as told by your doctor. Stop exercising if you start having cramps.  Eat regular, healthy meals.  Wear a good support bra if your breasts are tender.  Do not use hot tubs, steam rooms, or saunas.  Wear your seat belt when driving.  Avoid raw meat, uncooked cheese, and liter boxes and soil used by cats.  Take your prenatal vitamins.  Take 1500-2000 milligrams of calcium daily starting at the 20th week of pregnancy until you deliver your baby.  Try taking medicine that helps you poop (stool softener) as needed, and if your doctor approves. Eat more fiber by eating fresh fruit, vegetables, and whole grains. Drink enough fluids to keep your pee (urine) clear or pale yellow.  Take warm water baths (sitz baths) to soothe pain or discomfort caused by hemorrhoids. Use hemorrhoid cream if your doctor approves.  If you have puffy, bulging veins (varicose veins), wear support hose. Raise (elevate) your feet for 15 minutes, 3-4 times a day. Limit salt in your diet.  Avoid heavy  lifting, wear low heals, and sit up straight.  Rest with your legs raised if you have leg cramps or low back pain.  Visit your dentist if you have not gone during your pregnancy. Use a soft toothbrush to brush your teeth. Be gentle when you floss.  You can have sex (intercourse) unless your doctor tells you not to.  Go to your doctor visits. Get help if:  You feel dizzy.  You have mild cramps or pressure in your lower belly (abdomen).  You have a nagging pain in your belly area.  You continue to feel sick to your stomach (nauseous), throw up (vomit), or have watery poop (diarrhea).  You have bad smelling fluid coming from your vagina.  You have pain with peeing (urination). Get help right away if:  You have a fever.  You are leaking fluid from your vagina.  You have spotting or bleeding from your vagina.  You have severe belly cramping or pain.  You lose or gain weight rapidly.  You have trouble catching your breath and have chest pain.  You notice sudden or extreme puffiness (swelling) of your face, hands, ankles, feet, or legs.  You have not felt the baby move in over an hour.  You have severe headaches that do not go away with medicine.  You have vision changes. This information is not intended to replace advice given to you by your health care provider. Make sure you discuss any questions you have with your health care   provider. Document Released: 06/10/2009 Document Revised: 08/22/2015 Document Reviewed: 05/17/2012 Elsevier Interactive Patient Education  2017 Elsevier Inc.  

## 2017-08-09 NOTE — MAU Note (Signed)
Pt presents to MAU with c/o pelvic pain and tail bone pain that started yesterday. Pt denies VB and D/C.

## 2017-08-09 NOTE — MAU Provider Note (Signed)
History     CSN: [id number]  Arrival date and time: 08/09/17 2117   First Provider Initiated Contact with Patient 08/09/17 2141      Chief Complaint  Patient presents with  . Pelvic Pain   G2P1001 .0 wks here with pubic bone and tail bone pain. Pain started yesterday. Worse with standing and walking. Better if she supports her pelvis "like I'm hold a diaper on me". Has not taken anything for the pain. Rates 8/10. No recent injury or fall. Denies VB or discharge. No urinary sx.    OB History    Gravida  2   Para  1   Term  1   Preterm      AB      Living  1     SAB      TAB      Ectopic      Multiple      Live Births  1           Past Medical History:  Diagnosis Date  . Medical history non-contributory     Past Surgical History:  Procedure Laterality Date  . NO PAST SURGERIES      History reviewed. No pertinent family history.  Social History   Tobacco Use  . Smoking status: Never Smoker  . Smokeless tobacco: Never Used  Substance Use Topics  . Alcohol use: No  . Drug use: No    Allergies: No Known Allergies  Medications Prior to Admission  Medication Sig Dispense Refill Last Dose  . Prenatal Vit-Fe Fumarate-FA (PRENATAL MULTIVITAMIN) TABS tablet Take 1 tablet by mouth daily.    08/08/2017 at Unknown time  . Doxylamine-Pyridoxine (DICLEGIS) 10-10 MG TBEC Take 1 tablet by mouth 2 (two) times daily. 60 tablet 2 More than a month at Unknown time  . metoCLOPramide (REGLAN) 10 MG tablet Take 1 tablet (10 mg total) by mouth every 6 (six) hours. (Patient not taking: Reported on 07/14/2017) 30 tablet 0 Unknown at Unknown time  . ondansetron (ZOFRAN) 4 MG tablet Take 1 tablet (4 mg total) by mouth every 6 (six) hours. 12 tablet 0 More than a month at Unknown time  . promethazine (PHENERGAN) 25 MG tablet Take 1 tablet (25 mg total) by mouth every 6 (six) hours as needed for nausea or vomiting. (Patient not taking: Reported on 07/14/2017) 30 tablet 2  More than a month at Unknown time    Review of Systems  Constitutional: Negative for fever.  Gastrointestinal: Negative for abdominal pain, constipation, diarrhea, nausea and vomiting.  Genitourinary: Positive for pelvic pain. Negative for dysuria, vaginal bleeding and vaginal discharge.   Physical Exam   Blood pressure (!) 110/50, temperature 98.3 F (36.8 C), temperature source Oral, resp. rate 18, height  (1.676 m), weight 128 lb (58.1 kg), last menstrual period 05/03/2017, unknown if currently breastfeeding.  Physical Exam  Constitutional: She is oriented to person, place, and time. She appears well-developed and well-nourished. No distress.  HENT:  Head: Normocephalic and atraumatic.  Neck: Normal range of motion.  Respiratory: Effort normal. No respiratory distress.  GI: Soft. She exhibits no distension and no mass. There is no tenderness. There is no rebound and no guarding.  Genitourinary:     Genitourinary Comments: External: no lesions or erythema Vagina: rugated, pink, moist, thick white discharge Cervix closed/long No tenderness with palpation of pub arch   Musculoskeletal: Normal range of motion.       Cervical back: Normal.  Thoracic back: Normal.       Lumbar back: Normal.  Neurological: She is alert and oriented to person, place, and time.  Skin: Skin is warm.  Psychiatric: She has a normal mood and affect.  FHT 162  Results for orders placed or performed during the hospital encounter of 08/09/17 (from the past 24 hour(s))  Urinalysis, Routine w reflex microscopic     Status: Abnormal   Collection Time: 08/09/17  9:25 PM  Result Value Ref Range   Color, Urine YELLOW YELLOW   APPearance HAZY (A) CLEAR   Specific Gravity, Urine 1.020 1.005 - 1.030   pH 5.0 5.0 - 8.0   Glucose, UA NEGATIVE NEGATIVE mg/dL   Hgb urine dipstick NEGATIVE NEGATIVE   Bilirubin Urine NEGATIVE NEGATIVE   Ketones, ur NEGATIVE NEGATIVE mg/dL   Protein, ur NEGATIVE  NEGATIVE mg/dL   Nitrite NEGATIVE NEGATIVE   Leukocytes, UA NEGATIVE NEGATIVE   MAU Course  Procedures Tylenol  MDM Labs ordered and reviewed. Tail bone pain better, no change in pubic bone pain. No evidence of UTI or imminent SAB. Pain likely MSK d/t pregnancy. Discussed comfort measures. Could consider PT or Chiro care if persists. Presentation, clinical findings, and plan discussed with Dr. Chestine Spore. Stable for discharge home.  Assessment and Plan   1. [redacted] weeks gestation of pregnancy   2. Pelvic pain affecting pregnancy in second trimester, antepartum    Discharge home Follow up in OB office as scheduled SAB precautions Tylenol prn Warm tab baths for comfort  Allergies as of 08/09/2017   No Known Allergies     Medication List    STOP taking these medications   metoCLOPramide 10 MG tablet Commonly known as:  REGLAN   promethazine 25 MG tablet Commonly known as:  PHENERGAN     TAKE these medications   Doxylamine-Pyridoxine 10-10 MG Tbec Commonly known as:  DICLEGIS Take 1 tablet by mouth 2 (two) times daily.   ondansetron 4 MG tablet Commonly known as:  ZOFRAN Take 1 tablet (4 mg total) by mouth every 6 (six) hours.   prenatal multivitamin Tabs tablet Take 1 tablet by mouth daily.      Donette Larry, CNM 08/09/2017, 9:53 PM

## 2017-08-10 LAB — GC/CHLAMYDIA PROBE AMP (~~LOC~~) NOT AT ARMC
CHLAMYDIA, DNA PROBE: NEGATIVE
NEISSERIA GONORRHEA: NEGATIVE

## 2017-08-30 DIAGNOSIS — Z1371 Encounter for nonprocreative screening for genetic disease carrier status: Secondary | ICD-10-CM | POA: Diagnosis not present

## 2017-08-30 DIAGNOSIS — Z3480 Encounter for supervision of other normal pregnancy, unspecified trimester: Secondary | ICD-10-CM | POA: Diagnosis not present

## 2017-09-15 DIAGNOSIS — Z363 Encounter for antenatal screening for malformations: Secondary | ICD-10-CM | POA: Diagnosis not present

## 2017-09-17 ENCOUNTER — Other Ambulatory Visit (HOSPITAL_COMMUNITY): Payer: Self-pay | Admitting: Obstetrics

## 2017-09-17 DIAGNOSIS — O35EXX0 Maternal care for other (suspected) fetal abnormality and damage, fetal genitourinary anomalies, not applicable or unspecified: Secondary | ICD-10-CM

## 2017-09-17 DIAGNOSIS — Z3689 Encounter for other specified antenatal screening: Secondary | ICD-10-CM

## 2017-09-17 DIAGNOSIS — Z3A20 20 weeks gestation of pregnancy: Secondary | ICD-10-CM

## 2017-09-17 DIAGNOSIS — O358XX0 Maternal care for other (suspected) fetal abnormality and damage, not applicable or unspecified: Secondary | ICD-10-CM

## 2017-09-24 ENCOUNTER — Ambulatory Visit (HOSPITAL_COMMUNITY)
Admission: RE | Admit: 2017-09-24 | Discharge: 2017-09-24 | Disposition: A | Discharge status: Home / Self Care | Payer: 59 | Source: Ambulatory Visit | Attending: Obstetrics | Admitting: Obstetrics

## 2017-09-28 ENCOUNTER — Encounter (HOSPITAL_COMMUNITY): Payer: Self-pay

## 2017-10-06 ENCOUNTER — Other Ambulatory Visit: Payer: Self-pay

## 2017-10-06 ENCOUNTER — Encounter (HOSPITAL_COMMUNITY): Payer: Self-pay

## 2017-10-06 ENCOUNTER — Ambulatory Visit (HOSPITAL_COMMUNITY)
Admission: RE | Admit: 2017-10-06 | Discharge: 2017-10-06 | Disposition: A | Discharge status: Home / Self Care | Payer: 59 | Source: Ambulatory Visit | Attending: Obstetrics | Admitting: Obstetrics

## 2017-10-06 DIAGNOSIS — Z363 Encounter for antenatal screening for malformations: Secondary | ICD-10-CM | POA: Diagnosis not present

## 2017-10-06 DIAGNOSIS — Z3A22 22 weeks gestation of pregnancy: Secondary | ICD-10-CM | POA: Insufficient documentation

## 2017-10-06 DIAGNOSIS — O358XX0 Maternal care for other (suspected) fetal abnormality and damage, not applicable or unspecified: Secondary | ICD-10-CM | POA: Diagnosis not present

## 2017-10-06 DIAGNOSIS — O9989 Other specified diseases and conditions complicating pregnancy, childbirth and the puerperium: Secondary | ICD-10-CM | POA: Diagnosis not present

## 2017-10-06 DIAGNOSIS — Z362 Encounter for other antenatal screening follow-up: Secondary | ICD-10-CM | POA: Diagnosis not present

## 2017-10-06 DIAGNOSIS — Z3A20 20 weeks gestation of pregnancy: Secondary | ICD-10-CM

## 2017-10-06 DIAGNOSIS — Q620 Congenital hydronephrosis: Secondary | ICD-10-CM | POA: Diagnosis not present

## 2017-10-06 DIAGNOSIS — O35EXX0 Maternal care for other (suspected) fetal abnormality and damage, fetal genitourinary anomalies, not applicable or unspecified: Secondary | ICD-10-CM

## 2017-10-06 DIAGNOSIS — O283 Abnormal ultrasonic finding on antenatal screening of mother: Secondary | ICD-10-CM | POA: Insufficient documentation

## 2017-10-06 DIAGNOSIS — Z3689 Encounter for other specified antenatal screening: Secondary | ICD-10-CM

## 2017-10-06 DIAGNOSIS — O359XX0 Maternal care for (suspected) fetal abnormality and damage, unspecified, not applicable or unspecified: Secondary | ICD-10-CM | POA: Insufficient documentation

## 2017-10-07 ENCOUNTER — Other Ambulatory Visit (HOSPITAL_COMMUNITY): Payer: Self-pay | Admitting: *Deleted

## 2017-10-07 DIAGNOSIS — O35EXX0 Maternal care for other (suspected) fetal abnormality and damage, fetal genitourinary anomalies, not applicable or unspecified: Secondary | ICD-10-CM

## 2017-10-07 DIAGNOSIS — O358XX0 Maternal care for other (suspected) fetal abnormality and damage, not applicable or unspecified: Secondary | ICD-10-CM

## 2017-10-13 ENCOUNTER — Other Ambulatory Visit: Payer: Self-pay

## 2017-10-13 ENCOUNTER — Telehealth (HOSPITAL_COMMUNITY): Payer: Self-pay | Admitting: *Deleted

## 2017-10-13 DIAGNOSIS — Z369 Encounter for antenatal screening, unspecified: Secondary | ICD-10-CM | POA: Diagnosis not present

## 2017-10-13 NOTE — Telephone Encounter (Signed)
Attempted to call pt regarding low risk Panorama result.  Will attempt to call again at a later time.

## 2017-10-14 ENCOUNTER — Telehealth (HOSPITAL_COMMUNITY): Payer: Self-pay | Admitting: *Deleted

## 2017-10-29 ENCOUNTER — Telehealth (HOSPITAL_COMMUNITY): Payer: Self-pay | Admitting: *Deleted

## 2017-11-09 DIAGNOSIS — D649 Anemia, unspecified: Secondary | ICD-10-CM | POA: Diagnosis not present

## 2017-11-09 DIAGNOSIS — Z369 Encounter for antenatal screening, unspecified: Secondary | ICD-10-CM | POA: Diagnosis not present

## 2017-11-09 DIAGNOSIS — Z3480 Encounter for supervision of other normal pregnancy, unspecified trimester: Secondary | ICD-10-CM | POA: Diagnosis not present

## 2017-11-17 ENCOUNTER — Ambulatory Visit (HOSPITAL_COMMUNITY)
Admission: RE | Admit: 2017-11-17 | Discharge: 2017-11-17 | Disposition: A | Discharge status: Home / Self Care | Payer: 59 | Source: Ambulatory Visit | Attending: Obstetrics | Admitting: Obstetrics

## 2017-11-17 ENCOUNTER — Other Ambulatory Visit (HOSPITAL_COMMUNITY): Payer: Self-pay | Admitting: Obstetrics and Gynecology

## 2017-11-17 ENCOUNTER — Other Ambulatory Visit (HOSPITAL_COMMUNITY): Payer: Self-pay | Admitting: *Deleted

## 2017-11-17 ENCOUNTER — Encounter (HOSPITAL_COMMUNITY): Payer: Self-pay

## 2017-11-17 DIAGNOSIS — O358XX0 Maternal care for other (suspected) fetal abnormality and damage, not applicable or unspecified: Secondary | ICD-10-CM | POA: Diagnosis not present

## 2017-11-17 DIAGNOSIS — Z3A28 28 weeks gestation of pregnancy: Secondary | ICD-10-CM | POA: Insufficient documentation

## 2017-11-17 DIAGNOSIS — O359XX0 Maternal care for (suspected) fetal abnormality and damage, unspecified, not applicable or unspecified: Secondary | ICD-10-CM | POA: Diagnosis not present

## 2017-11-17 DIAGNOSIS — Z362 Encounter for other antenatal screening follow-up: Secondary | ICD-10-CM | POA: Insufficient documentation

## 2017-11-17 DIAGNOSIS — O35EXX0 Maternal care for other (suspected) fetal abnormality and damage, fetal genitourinary anomalies, not applicable or unspecified: Secondary | ICD-10-CM

## 2017-11-17 DIAGNOSIS — Z3686 Encounter for antenatal screening for cervical length: Secondary | ICD-10-CM

## 2017-11-17 DIAGNOSIS — Z0489 Encounter for examination and observation for other specified reasons: Secondary | ICD-10-CM

## 2017-11-17 DIAGNOSIS — O283 Abnormal ultrasonic finding on antenatal screening of mother: Secondary | ICD-10-CM | POA: Diagnosis not present

## 2017-11-17 DIAGNOSIS — N1330 Unspecified hydronephrosis: Secondary | ICD-10-CM | POA: Insufficient documentation

## 2017-11-17 DIAGNOSIS — IMO0002 Reserved for concepts with insufficient information to code with codable children: Secondary | ICD-10-CM

## 2017-11-17 DIAGNOSIS — O4443 Low lying placenta NOS or without hemorrhage, third trimester: Secondary | ICD-10-CM | POA: Diagnosis not present

## 2017-11-17 DIAGNOSIS — O9989 Other specified diseases and conditions complicating pregnancy, childbirth and the puerperium: Secondary | ICD-10-CM | POA: Insufficient documentation

## 2017-11-27 ENCOUNTER — Observation Stay
Admission: EM | Admit: 2017-11-27 | Discharge: 2017-11-27 | Disposition: A | Discharge status: Home / Self Care | Payer: 59 | Attending: Obstetrics and Gynecology | Admitting: Obstetrics and Gynecology

## 2017-11-27 ENCOUNTER — Other Ambulatory Visit: Payer: Self-pay

## 2017-11-27 DIAGNOSIS — O99013 Anemia complicating pregnancy, third trimester: Secondary | ICD-10-CM

## 2017-11-27 DIAGNOSIS — O9989 Other specified diseases and conditions complicating pregnancy, childbirth and the puerperium: Secondary | ICD-10-CM

## 2017-11-27 DIAGNOSIS — R51 Headache: Secondary | ICD-10-CM | POA: Insufficient documentation

## 2017-11-27 DIAGNOSIS — O26893 Other specified pregnancy related conditions, third trimester: Secondary | ICD-10-CM | POA: Diagnosis not present

## 2017-11-27 DIAGNOSIS — Z3A29 29 weeks gestation of pregnancy: Secondary | ICD-10-CM | POA: Insufficient documentation

## 2017-11-27 DIAGNOSIS — R519 Headache, unspecified: Secondary | ICD-10-CM | POA: Diagnosis present

## 2017-11-27 HISTORY — DX: Anemia complicating pregnancy, third trimester: O99.013

## 2017-11-27 LAB — HEMOGLOBIN AND HEMATOCRIT, BLOOD
HCT: 25.3 % — ABNORMAL LOW (ref 35.0–47.0)
Hemoglobin: 8.7 g/dL — ABNORMAL LOW (ref 12.0–16.0)

## 2017-11-27 MED ORDER — FERROUS SULFATE 325 (65 FE) MG PO TABS: 325.0000 mg | ORAL_TABLET | Freq: Two times a day (BID) | ORAL | 3 refills | Status: DC

## 2017-11-27 MED ORDER — BUTALBITAL-APAP-CAFFEINE 50-325-40 MG PO TABS
2.0000 | ORAL_TABLET | Freq: Four times a day (QID) | ORAL | Status: DC | PRN
  Administered 2017-11-27: 2 via ORAL
  Filled 2017-11-27: qty 2

## 2017-11-27 NOTE — OB Triage Note (Signed)
Patient here for complaints of LOF at around 230. Patient reports it was one large gush clear in color some mucous present. She states that hs has not been feeling well today and has a "terrribe" headache not relieved by tylenol, she is also "jittery, shaky" and had some black spots in her vision earlier.

## 2017-11-27 NOTE — Final Progress Note (Signed)
L&D OB Triage Note  Lorraine Stevens is an unassigned 27 y.o. G2P1001 female at 4225w5d, EDD Estimated Date of Delivery: 02/07/18 who presented to triage for complaints of headache x 3 days and general malaise. [facility] [place] She was currently working in [employer] when she notes that she "just wasn't feeling right". She was evaluated by the nurses with no significant findings. Vital signs stable. An NST was performed and has been reviewed by MD. She was treated with Fioricet and given PO diet.   NST INTERPRETATION: Indications: patient reassurance  Mode: External Baseline Rate (A): 125 bpm Variability: Moderate Accelerations: 15 x 15 Decelerations: None     Contraction Frequency (min): 0  Impression: reactive   Labs:  Results for orders placed or performed during the hospital encounter of 11/27/17  Hemoglobin and hematocrit, blood  Result Value Ref Range   Hemoglobin 8.7 (L) 12.0 - 16.0 g/dL   HCT 16.125.3 (L) 09.635.0 - 04.547.0 %      Plan: NST performed was reviewed and was found to be reactive. Her headache improved with Fioricet.   She was discharged home and advised to rest today. 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  PTL precautions given. Prescription for ferrous sulfate given, take BID with meals.  Continue routine prenatal care. Follow up with OB/GYN as previously scheduled. Has appointment next week.     Hildred LaserAnika Chevez Sambrano, MD [facility]

## 2017-11-27 NOTE — Discharge Instructions (Signed)
Warm baths  Warm compresses  Tylenol as needed for headaches or leg pain  Hydrate well with 8 glasses of water daily and add in sports drinks like gatorade

## 2017-12-08 ENCOUNTER — Encounter (HOSPITAL_COMMUNITY): Payer: Self-pay

## 2017-12-08 ENCOUNTER — Other Ambulatory Visit: Payer: Self-pay

## 2017-12-08 ENCOUNTER — Inpatient Hospital Stay (HOSPITAL_COMMUNITY)
Admission: AD | Admit: 2017-12-08 | Discharge: 2017-12-08 | Disposition: A | Discharge status: Home / Self Care | Payer: 59 | Source: Ambulatory Visit | Attending: Obstetrics and Gynecology | Admitting: Obstetrics and Gynecology

## 2017-12-08 DIAGNOSIS — Z3A31 31 weeks gestation of pregnancy: Secondary | ICD-10-CM | POA: Insufficient documentation

## 2017-12-08 DIAGNOSIS — Z79899 Other long term (current) drug therapy: Secondary | ICD-10-CM | POA: Insufficient documentation

## 2017-12-08 DIAGNOSIS — D649 Anemia, unspecified: Secondary | ICD-10-CM | POA: Diagnosis not present

## 2017-12-08 DIAGNOSIS — Z3689 Encounter for other specified antenatal screening: Secondary | ICD-10-CM

## 2017-12-08 DIAGNOSIS — Z369 Encounter for antenatal screening, unspecified: Secondary | ICD-10-CM | POA: Diagnosis not present

## 2017-12-08 DIAGNOSIS — O99013 Anemia complicating pregnancy, third trimester: Secondary | ICD-10-CM | POA: Insufficient documentation

## 2017-12-08 LAB — CBC
HCT: 27.1 % — ABNORMAL LOW (ref 36.0–46.0)
Hemoglobin: 9.1 g/dL — ABNORMAL LOW (ref 12.0–15.0)
MCH: 30.1 pg (ref 26.0–34.0)
MCHC: 33.6 g/dL (ref 30.0–36.0)
MCV: 89.7 fL (ref 78.0–100.0)
PLATELETS: 218 10*3/uL (ref 150–400)
RBC: 3.02 MIL/uL — AB (ref 3.87–5.11)
RDW: 14.9 % (ref 11.5–15.5)
WBC: 7.9 10*3/uL (ref 4.0–10.5)

## 2017-12-08 MED ORDER — SODIUM CHLORIDE 0.9 % IV SOLN
510.0000 mg | Freq: Once | INTRAVENOUS | Status: AC
  Administered 2017-12-08: 510 mg via INTRAVENOUS
  Filled 2017-12-08: qty 17

## 2017-12-08 NOTE — Discharge Instructions (Signed)
Pregnancy and Anemia Anemia is a condition in which the concentration of red blood cells or hemoglobin in the blood is below normal. Hemoglobin is a substance in red blood cells that carries oxygen to the tissues of the body. Anemia results in not enough oxygen reaching these tissues. Anemia during pregnancy is common because the fetus uses more iron and folic acid as it is developing. Your body may not produce enough red blood cells because of this. Also, during pregnancy, the liquid part of the blood (plasma) increases by about 50%, and the red blood cells increase by only 25%. This lowers the concentration of the red blood cells and creates a natural anemia-like situation. What are the causes? The most common cause of anemia during pregnancy is not having enough iron in the body to make red blood cells (iron deficiency anemia). Other causes may include:  Folic acid deficiency.  Vitamin B12 deficiency.  Certain prescription or over-the-counter medicines.  Certain medical conditions or infections that destroy red blood cells.  A low platelet count and bleeding caused by antibodies that go through the placenta to the fetus from the mother's blood. What are the signs or symptoms? Mild anemia may not be noticeable. If it becomes severe, symptoms may include:  Tiredness.  Shortness of breath, especially with exercise.  Weakness.  Fainting.  Pale looking skin.  Headaches.  Feeling a fast or irregular heartbeat (palpitations). How is this diagnosed? The type of anemia is usually diagnosed from your family and medical history and blood tests. How is this treated? Treatment of anemia during pregnancy depends on the cause of the anemia. Treatment can include:  Supplements of iron, vitamin B12, or folic acid.  A blood transfusion. This may be needed if blood loss is severe.  Hospitalization. This may be needed if there is significant continual blood loss.  Dietary changes. Follow  these instructions at home:  Follow your dietitian's or health care provider's dietary recommendations.  Increase your vitamin C intake. This will help the stomach absorb more iron.  Eat a diet rich in iron. This would include foods such as:  Liver.  Beef.  Whole grain bread.  Eggs.  Dried fruit.  Take iron and vitamins as directed by your health care provider.  Eat green leafy vegetables. These are a good source of folic acid. Contact a health care provider if:  You have frequent or lasting headaches.  You are looking pale.  You are bruising easily. Get help right away if:  You have extreme weakness, shortness of breath, or chest pain.  You become dizzy or have trouble concentrating.  You have heavy vaginal bleeding.  You develop a rash.  You have bloody or black, tarry stools.  You faint.  You vomit up blood.  You vomit repeatedly.  You have abdominal pain.  You have a fever or persistent symptoms for more than 2-3 days.  You have a fever and your symptoms suddenly get worse.  You are dehydrated. This information is not intended to replace advice given to you by your health care provider. Make sure you discuss any questions you have with your health care provider. Document Released: 03/13/2000 Document Revised: 08/22/2015 Document Reviewed: 10/26/2012 Elsevier Interactive Patient Education  2017 Elsevier Inc.  

## 2017-12-08 NOTE — MAU Provider Note (Signed)
History     CSN: [id number]  Arrival date and time: 12/08/17 1616   First Provider Initiated Contact with Patient 12/08/17 1708      Chief Complaint  Patient presents with  . Anemia   G2P1001 @31 .2 wks sent from office for SOB. Reports SOB has been ongoing since earlier in pregnancy. SOB is worse with ambulation but occurs occasionally when changing position in bed. No CP or tightness. No cough or cold sx. Reports good FM. No VB, LOF, or ctx. Her pregnancy has been complicated by anemia (on 11/27/17>8.7/25.2).   OB History    Gravida  2   Para  1   Term  1   Preterm      AB      Living  1     SAB      TAB      Ectopic      Multiple      Live Births  1           Past Medical History:  Diagnosis Date  . Medical history non-contributory     Past Surgical History:  Procedure Laterality Date  . NO PAST SURGERIES    . WISDOM TOOTH EXTRACTION      Family History  Problem Relation Age of Onset  . Hypertension Father   . Stroke Maternal Grandmother     Social History   Tobacco Use  . Smoking status: Never Smoker  . Smokeless tobacco: Never Used  Substance Use Topics  . Alcohol use: No  . Drug use: No    Allergies: No Known Allergies  Medications Prior to Admission  Medication Sig Dispense Refill Last Dose  . Doxylamine-Pyridoxine (BONJESTA PO) Take by mouth.   Completed Course at Unknown time  . Doxylamine-Pyridoxine (DICLEGIS) 10-10 MG TBEC Take 1 tablet by mouth 2 (two) times daily. (Patient not taking: Reported on 11/17/2017) 60 tablet 2 Not Taking  . ferrous sulfate (FERROUSUL) 325 (65 FE) MG tablet Take 1 tablet (325 mg total) by mouth 2 (two) times daily with a meal. 60 tablet 3   . ondansetron (ZOFRAN) 4 MG tablet Take 1 tablet (4 mg total) by mouth every 6 (six) hours. (Patient not taking: Reported on 10/06/2017) 12 tablet 0 Not Taking  . Prenatal Vit-Fe Fumarate-FA (PRENATAL MULTIVITAMIN) TABS tablet Take 1 tablet by mouth daily.    Taking     Review of Systems  Constitutional: Positive for fatigue.  Respiratory: Positive for shortness of breath. Negative for cough, chest tightness and wheezing.   Cardiovascular: Negative for chest pain and leg swelling.  Gastrointestinal: Negative for abdominal pain.  Genitourinary: Negative for vaginal bleeding.   Physical Exam   Blood pressure 116/76, pulse 94, temperature 98.5 F (36.9 C), temperature source Oral, resp. rate 16, weight 69.2 kg, last menstrual period 05/03/2017, SpO2 100 %, unknown if currently breastfeeding.  Physical Exam  Constitutional: She is oriented to person, place, and time. She appears well-developed and well-nourished. No distress.  HENT:  Head: Normocephalic and atraumatic.  Neck: Normal range of motion.  Cardiovascular: Normal rate, regular rhythm and normal heart sounds.  Respiratory: Effort normal and breath sounds normal. No respiratory distress. She has no wheezes. She has no rales.  GI: Soft. She exhibits no distension. There is no tenderness.  gravid  Musculoskeletal: Normal range of motion.  Neurological: She is alert and oriented to person, place, and time.  Skin: Skin is warm and dry.  Psychiatric: She has a normal mood and affect.  EFM: 145 bpm, mod variability, + accels, no decels Toco: none  No results found for this or any previous visit (from the past 24 hour(s)).  MAU Course  Procedures Feraheme  MDM Labs ordered and reviewed. No evidence of PE. Sx likely d/t anemia and advancing pregnancy. Presentation, clinical findings, and plan discussed with Dr. Claiborne Billings. Stable for discharge home.  Assessment and Plan   1. [redacted] weeks gestation of pregnancy   2. NST (non-stress test) reactive   3. Anemia during pregnancy in third trimester    Discharge home Follow up in OB office as scheduled Start Fe (Rx sent 2 weeks ago) Fe rich foods  Allergies as of 12/08/2017   No Known Allergies     Medication List    STOP taking these  medications   BONJESTA PO   Doxylamine-Pyridoxine 10-10 MG Tbec   ondansetron 4 MG tablet Commonly known as:  ZOFRAN     TAKE these medications   ferrous sulfate 325 (65 FE) MG tablet Take 1 tablet (325 mg total) by mouth 2 (two) times daily with a meal.   prenatal multivitamin Tabs tablet Take 1 tablet by mouth daily.      Donette Larry, CNM 12/08/2017, 5:26 PM

## 2017-12-08 NOTE — MAU Note (Signed)
Urine in lab 

## 2017-12-08 NOTE — MAU Note (Signed)
Sent over from the office.  Hx of anemia.  Pt unsure of plan.  Has been dizzy- mainly at work, passes out.

## 2017-12-13 ENCOUNTER — Other Ambulatory Visit: Payer: Self-pay | Admitting: Obstetrics and Gynecology

## 2017-12-15 ENCOUNTER — Encounter (HOSPITAL_COMMUNITY): Payer: Self-pay

## 2017-12-15 ENCOUNTER — Inpatient Hospital Stay (HOSPITAL_COMMUNITY)
Admission: AD | Admit: 2017-12-15 | Discharge: 2017-12-15 | Disposition: A | Discharge status: Home / Self Care | Payer: 59 | Source: Ambulatory Visit | Attending: Obstetrics and Gynecology | Admitting: Obstetrics and Gynecology

## 2017-12-15 DIAGNOSIS — Z9889 Other specified postprocedural states: Secondary | ICD-10-CM | POA: Diagnosis not present

## 2017-12-15 DIAGNOSIS — D649 Anemia, unspecified: Secondary | ICD-10-CM | POA: Diagnosis not present

## 2017-12-15 DIAGNOSIS — Z79899 Other long term (current) drug therapy: Secondary | ICD-10-CM | POA: Insufficient documentation

## 2017-12-15 DIAGNOSIS — R519 Headache, unspecified: Secondary | ICD-10-CM

## 2017-12-15 DIAGNOSIS — Z8249 Family history of ischemic heart disease and other diseases of the circulatory system: Secondary | ICD-10-CM | POA: Insufficient documentation

## 2017-12-15 DIAGNOSIS — Z3A32 32 weeks gestation of pregnancy: Secondary | ICD-10-CM | POA: Diagnosis not present

## 2017-12-15 DIAGNOSIS — O99013 Anemia complicating pregnancy, third trimester: Secondary | ICD-10-CM

## 2017-12-15 DIAGNOSIS — O26893 Other specified pregnancy related conditions, third trimester: Secondary | ICD-10-CM

## 2017-12-15 DIAGNOSIS — R51 Headache: Secondary | ICD-10-CM

## 2017-12-15 DIAGNOSIS — Z823 Family history of stroke: Secondary | ICD-10-CM | POA: Diagnosis not present

## 2017-12-15 MED ORDER — SODIUM CHLORIDE 0.9 % IV SOLN
510.0000 mg | Freq: Once | INTRAVENOUS | Status: AC
  Administered 2017-12-15: 510 mg via INTRAVENOUS
  Filled 2017-12-15: qty 17

## 2017-12-15 MED ORDER — SODIUM CHLORIDE 0.9 % IV SOLN
INTRAVENOUS | Status: DC
  Administered 2017-12-15: 19:00:00 via INTRAVENOUS

## 2017-12-15 MED ORDER — ACETAMINOPHEN 500 MG PO TABS
1000.0000 mg | ORAL_TABLET | Freq: Four times a day (QID) | ORAL | Status: DC | PRN
  Administered 2017-12-15: 1000 mg via ORAL
  Filled 2017-12-15: qty 2

## 2017-12-15 NOTE — MAU Provider Note (Signed)
History     CSN: [id number]  Arrival date and time: 12/15/17 1605   First Provider Initiated Contact with Patient 12/15/17 1936     G2P1001 @32 .2 wks here for Fe infusion and now c/o frontal HA after infusion completed. Reports this happened a few hours after her last Fe infusion. No blurry vision but is seeing "swirlies". No N/V. Feeling good FM. No pregnancy concerns.   OB History    Gravida  2   Para  1   Term  1   Preterm      AB      Living  1     SAB      TAB      Ectopic      Multiple      Live Births  1           Past Medical History:  Diagnosis Date  . Medical history non-contributory     Past Surgical History:  Procedure Laterality Date  . NO PAST SURGERIES    . WISDOM TOOTH EXTRACTION      Family History  Problem Relation Age of Onset  . Hypertension Father   . Stroke Maternal Grandmother     Social History   Tobacco Use  . Smoking status: Never Smoker  . Smokeless tobacco: Never Used  Substance Use Topics  . Alcohol use: No  . Drug use: No    Allergies: No Known Allergies  Medications Prior to Admission  Medication Sig Dispense Refill Last Dose  . ferrous sulfate (FERROUSUL) 325 (65 FE) MG tablet Take 1 tablet (325 mg total) by mouth 2 (two) times daily with a meal. 60 tablet 3   . Prenatal Vit-Fe Fumarate-FA (PRENATAL MULTIVITAMIN) TABS tablet Take 1 tablet by mouth daily.    Taking    Review of Systems  Eyes: Negative for visual disturbance.  Gastrointestinal: Negative for abdominal pain.  Genitourinary: Negative for vaginal bleeding.  Neurological: Positive for headaches.   Physical Exam   Blood pressure 104/61, pulse 88, temperature 98.8 F (37.1 C), temperature source Oral, resp. rate 17, height 5\' 5"  (1.651 m), weight 70.3 kg, last menstrual period 05/03/2017, SpO2 100 %, unknown if currently breastfeeding.  Physical Exam  Constitutional: She is oriented to person, place, and time. She appears well-developed and  well-nourished. No distress.  HENT:  Head: Normocephalic and atraumatic.  Eyes: Pupils are equal, round, and reactive to light.  Neck: Normal range of motion.  Cardiovascular: Normal rate.  Respiratory: Effort normal. No respiratory distress.  Musculoskeletal: Normal range of motion. She exhibits no edema.  Neurological: She is alert and oriented to person, place, and time. She has normal reflexes. No cranial nerve deficit.  Skin: Skin is warm and dry.  Psychiatric: She has a normal mood and affect.  EFM: 140 bpm, mod variability, + accels, no decels Toco: none  No results found for this or any previous visit (from the past 24 hour(s)).  MAU Course  Procedures  MDM Normal neuro exam. HA improved after Tylenol. Most likely a SE of Feraheme. Presentation, clinical findings, and plan discussed with Dr. Claiborne Billingsallahan. Stable for discharge home.  Assessment and Plan   1. [redacted] weeks gestation of pregnancy   2. Acute nonintractable headache, unspecified headache type   3. Anemia of pregnancy in third trimester    Discharge home Follow up in OB office as scheduled Return precautions Continue po Fe  Allergies as of 12/15/2017   No Known Allergies  Medication List    TAKE these medications   ferrous sulfate 325 (65 FE) MG tablet Take 1 tablet (325 mg total) by mouth 2 (two) times daily with a meal.   prenatal multivitamin Tabs tablet Take 1 tablet by mouth daily.       Donette Larry, CNM 12/15/2017, 7:38 PM

## 2017-12-15 NOTE — MAU Note (Signed)
Pt notified we do not have orders for her iron infusion but I will be trying to obtain the orders. [facility] x [facility] with no answer, no recording. Call to [facility], no answer. Call to dr Claiborne Billingscallahan who is in a delivery and will call me back when possible. Pt notified of phone calls and will wait in lobby until callahan calls back.

## 2017-12-15 NOTE — Discharge Instructions (Signed)
Pregnancy and Anemia Anemia is a condition in which the concentration of red blood cells or hemoglobin in the blood is below normal. Hemoglobin is a substance in red blood cells that carries oxygen to the tissues of the body. Anemia results in not enough oxygen reaching these tissues. Anemia during pregnancy is common because the fetus uses more iron and folic acid as it is developing. Your body may not produce enough red blood cells because of this. Also, during pregnancy, the liquid part of the blood (plasma) increases by about 50%, and the red blood cells increase by only 25%. This lowers the concentration of the red blood cells and creates a natural anemia-like situation. What are the causes? The most common cause of anemia during pregnancy is not having enough iron in the body to make red blood cells (iron deficiency anemia). Other causes may include:  Folic acid deficiency.  Vitamin B12 deficiency.  Certain prescription or over-the-counter medicines.  Certain medical conditions or infections that destroy red blood cells.  A low platelet count and bleeding caused by antibodies that go through the placenta to the fetus from the mother's blood. What are the signs or symptoms? Mild anemia may not be noticeable. If it becomes severe, symptoms may include:  Tiredness.  Shortness of breath, especially with exercise.  Weakness.  Fainting.  Pale looking skin.  Headaches.  Feeling a fast or irregular heartbeat (palpitations). How is this diagnosed? The type of anemia is usually diagnosed from your family and medical history and blood tests. How is this treated? Treatment of anemia during pregnancy depends on the cause of the anemia. Treatment can include:  Supplements of iron, vitamin B12, or folic acid.  A blood transfusion. This may be needed if blood loss is severe.  Hospitalization. This may be needed if there is significant continual blood loss.  Dietary changes. Follow  these instructions at home:  Follow your dietitian's or health care provider's dietary recommendations.  Increase your vitamin C intake. This will help the stomach absorb more iron.  Eat a diet rich in iron. This would include foods such as:  Liver.  Beef.  Whole grain bread.  Eggs.  Dried fruit.  Take iron and vitamins as directed by your health care provider.  Eat green leafy vegetables. These are a good source of folic acid. Contact a health care provider if:  You have frequent or lasting headaches.  You are looking pale.  You are bruising easily. Get help right away if:  You have extreme weakness, shortness of breath, or chest pain.  You become dizzy or have trouble concentrating.  You have heavy vaginal bleeding.  You develop a rash.  You have bloody or black, tarry stools.  You faint.  You vomit up blood.  You vomit repeatedly.  You have abdominal pain.  You have a fever or persistent symptoms for more than 2-3 days.  You have a fever and your symptoms suddenly get worse.  You are dehydrated. This information is not intended to replace advice given to you by your health care provider. Make sure you discuss any questions you have with your health care provider. Document Released: 03/13/2000 Document Revised: 08/22/2015 Document Reviewed: 10/26/2012 Elsevier Interactive Patient Education  2017 Elsevier Inc.  

## 2017-12-20 DIAGNOSIS — Z369 Encounter for antenatal screening, unspecified: Secondary | ICD-10-CM | POA: Diagnosis not present

## 2017-12-23 ENCOUNTER — Other Ambulatory Visit: Payer: Self-pay

## 2017-12-23 ENCOUNTER — Observation Stay
Admission: EM | Admit: 2017-12-23 | Discharge: 2017-12-23 | Disposition: A | Discharge status: Home / Self Care | Payer: 59 | Attending: Obstetrics and Gynecology | Admitting: Obstetrics and Gynecology

## 2017-12-23 DIAGNOSIS — Z3A33 33 weeks gestation of pregnancy: Secondary | ICD-10-CM | POA: Diagnosis not present

## 2017-12-23 DIAGNOSIS — O26853 Spotting complicating pregnancy, third trimester: Secondary | ICD-10-CM | POA: Diagnosis not present

## 2017-12-23 DIAGNOSIS — O4703 False labor before 37 completed weeks of gestation, third trimester: Secondary | ICD-10-CM | POA: Diagnosis not present

## 2017-12-23 DIAGNOSIS — O4443 Low lying placenta NOS or without hemorrhage, third trimester: Secondary | ICD-10-CM | POA: Insufficient documentation

## 2017-12-23 DIAGNOSIS — O368130 Decreased fetal movements, third trimester, not applicable or unspecified: Secondary | ICD-10-CM

## 2017-12-23 DIAGNOSIS — Z3A34 34 weeks gestation of pregnancy: Secondary | ICD-10-CM | POA: Diagnosis not present

## 2017-12-23 DIAGNOSIS — Z3490 Encounter for supervision of normal pregnancy, unspecified, unspecified trimester: Secondary | ICD-10-CM

## 2017-12-23 LAB — URINALYSIS, COMPLETE (UACMP) WITH MICROSCOPIC
BACTERIA UA: NONE SEEN
BILIRUBIN URINE: NEGATIVE
Glucose, UA: NEGATIVE mg/dL
HGB URINE DIPSTICK: NEGATIVE
Ketones, ur: NEGATIVE mg/dL
Nitrite: NEGATIVE
PROTEIN: NEGATIVE mg/dL
Specific Gravity, Urine: 1.02 (ref 1.005–1.030)
pH: 6 (ref 5.0–8.0)

## 2017-12-23 NOTE — OB Triage Note (Signed)
Pt is G2P1 at [redacted]w[redacted]d c/o vaginal bleeding. Pt states quarter size spots of bright red blood are on the toilet paper when going to the bathroom. Pt denies LOF and positive fetal movement but stated today she feels like baby has moved less. Denies vaginal intercourse in the past week. Pt states she had vaginal bleeding Tuesday but it stopped when resting and has begun again today when she returned to work. Monitors applied and assessing. Initial FHT 155.

## 2017-12-23 NOTE — Progress Notes (Signed)
Discussed discharge at 2005 to home. Discharge information given to patient including follow up instructions, specifically on low lying placenta and vaginal bleeding. No vaginal bleeding noted during RN assessment. Patient verbalized understanding and agreed with discharge. Patient discharged in stable condition and ambulatory. All questions answered. Patient will follow up with [facility].

## 2017-12-27 NOTE — Discharge Summary (Signed)
    L&D OB Triage Note  SUBJECTIVE Lorraine Stevens is a 27 y.o. G2P1001 female at [redacted]w[redacted]d, EDD Estimated Date of Delivery: 02/07/18 who presented to triage with complaints of an episode of vaginal spotting at 33 weeks.  At the time of presentation she reported the bleeding had stopped.  She reports active fetal movement but "less than before".  She receives her prenatal care at [facility].  She has a known low-lying placenta.  OB History  Gravida Para Term Preterm AB Living  2 1 1  0 0 1  SAB TAB Ectopic Multiple Live Births  0 0 0 0 1    # Outcome Date GA Lbr Len/2nd Weight Sex Delivery Anes PTL Lv  2 Current           1 Term      Vag-Spont       No medications prior to admission.     OBJECTIVE  Nursing Evaluation:   BP 117/65 (BP Location: Right Arm)   Pulse 86   Temp 97.9 F (36.6 C) (Oral)   Resp 18   Ht 5\' 6"  (1.676 m)   Wt 72.6 kg   LMP 05/03/2017   BMI 25.82 kg/m    Findings:   No active bleeding.  NST was performed and has been reviewed by me.  NST INTERPRETATION: Category I  Mode: External Baseline Rate (A): 140 bpm Variability: Moderate Accelerations: 15 x 15 Decelerations: None     Contraction Frequency (min): none noted   ASSESSMENT Impression:  1.  Pregnancy:  G2P1001 at [redacted]w[redacted]d , EDD Estimated Date of Delivery: 02/07/18 2.  NST:  Category I  PLAN 1. Reassurance given 2. Discharge home with standard labor precautions given to return to L&D.  She has been asked to see the doctors at [facility] tomorrow for close follow-up. 3. Continue routine prenatal care.

## 2017-12-28 ENCOUNTER — Encounter (HOSPITAL_COMMUNITY): Payer: Self-pay | Admitting: *Deleted

## 2017-12-28 ENCOUNTER — Other Ambulatory Visit: Payer: Self-pay

## 2017-12-28 ENCOUNTER — Inpatient Hospital Stay (HOSPITAL_COMMUNITY)
Admission: AD | Admit: 2017-12-28 | Discharge: 2017-12-28 | Disposition: A | Discharge status: Home / Self Care | Payer: 59 | Source: Ambulatory Visit | Attending: Obstetrics and Gynecology | Admitting: Obstetrics and Gynecology

## 2017-12-28 DIAGNOSIS — O26853 Spotting complicating pregnancy, third trimester: Secondary | ICD-10-CM | POA: Diagnosis not present

## 2017-12-28 DIAGNOSIS — Z3A34 34 weeks gestation of pregnancy: Secondary | ICD-10-CM | POA: Insufficient documentation

## 2017-12-28 DIAGNOSIS — O26859 Spotting complicating pregnancy, unspecified trimester: Secondary | ICD-10-CM

## 2017-12-28 LAB — URINALYSIS, ROUTINE W REFLEX MICROSCOPIC
Bilirubin Urine: NEGATIVE
Glucose, UA: NEGATIVE mg/dL
Hgb urine dipstick: NEGATIVE
KETONES UR: 5 mg/dL — AB
Leukocytes, UA: NEGATIVE
NITRITE: NEGATIVE
PROTEIN: NEGATIVE mg/dL
SPECIFIC GRAVITY, URINE: 1.018 (ref 1.005–1.030)
pH: 6 (ref 5.0–8.0)

## 2017-12-28 NOTE — Discharge Instructions (Signed)
Vaginal Bleeding During Pregnancy, Third Trimester °A small amount of bleeding (spotting) from the vagina is common in pregnancy. Sometimes the bleeding is normal and is not a problem, and sometimes it is a sign of something serious. Be sure to tell your doctor about any bleeding from your vagina right away. °Follow these instructions at home: °· Watch your condition for any changes. °· Follow your doctor's instructions about how active you can be. °· If you are on bed rest: °? You may need to stay in bed and only get up to use the bathroom. °? You may be allowed to do some activities. °? If you need help, make plans for someone to help you. °· Write down: °? The number of pads you use each day. °? How often you change pads. °? How soaked (saturated) your pads are. °· Do not use tampons. °· Do not douche. °· Do not have sex or orgasms until your doctor says it is okay. °· Follow your doctor's advice about lifting, driving, and doing physical activities. °· If you pass any tissue from your vagina, save the tissue so you can show it to your doctor. °· Only take medicines as told by your doctor. °· Do not take aspirin because it can make you bleed. °· Keep all follow-up visits as told by your doctor. °Contact a doctor if: °· You bleed from your vagina. °· You have cramps. °· You have labor pains. °· You have a fever that does not go away after you take medicine. °Get help right away if: °· You have very bad cramps in your back or belly (abdomen). °· You have chills. °· You have a gush of fluid from your vagina. °· You pass large clots or tissue from your vagina. °· You bleed more. °· You feel light-headed or weak. °· You pass out (faint). °· You do not feel your baby move around as much as before. °This information is not intended to replace advice given to you by your health care provider. Make sure you discuss any questions you have with your health care provider. °Document Released: 07/31/2013 Document Revised:  08/22/2015 Document Reviewed: 11/21/2012 °Elsevier Interactive Patient Education © 2018 [employer] ° °

## 2017-12-28 NOTE — MAU Note (Signed)
Had vaginal bleeding yesterday, bright red spotting throughout the day.  None today. No c/o pain, unless standing for a long time. Started bleeding last Tues.  "low- lying" placenta.  Called office and was instructed to come here.

## 2017-12-28 NOTE — MAU Provider Note (Signed)
History     CSN: [id number]  Arrival date and time: 12/28/17 1001   First Provider Initiated Contact with Patient 12/28/17 1032      Chief Complaint  Patient presents with  . Vaginal Bleeding   HPI  Ms. Lorraine Stevens is a 27 y.o. G2P1001 at [redacted]w[redacted]d who presents to MAU today with complaint of spotting off and on since last Tuesday. She states intercourse last Monday prior to onset of bleeding. She went to [facility] ED with onset of bleeding and was sent home since bleeding had resolved. She has noted spotting off and on since then. She had some lower abdominal pain with activity over the weekend, but otherwise no pain associated with the bleeding. She denies pain or bleeding today. She denies contractions, LOF or complications with this or previous pregnancy. She reports normal fetal movement. She had low-lying placenta noted on [place] in July that had resolved on [place] in August.   OB History    Gravida  2   Para  1   Term  1   Preterm      AB      Living  1     SAB      TAB      Ectopic      Multiple      Live Births  1           Past Medical History:  Diagnosis Date  . Medical history non-contributory     Past Surgical History:  Procedure Laterality Date  . NO PAST SURGERIES    . WISDOM TOOTH EXTRACTION      Family History  Problem Relation Age of Onset  . Hypertension Father   . Stroke Maternal Grandmother     Social History   Tobacco Use  . Smoking status: Never Smoker  . Smokeless tobacco: Never Used  Substance Use Topics  . Alcohol use: No  . Drug use: No    Allergies: No Known Allergies  Medications Prior to Admission  Medication Sig Dispense Refill Last Dose  . ferrous sulfate (FERROUSUL) 325 (65 FE) MG tablet Take 1 tablet (325 mg total) by mouth 2 (two) times daily with a meal. (Patient not taking: Reported on 12/23/2017) 60 tablet 3 Not Taking at Unknown time  . Prenatal Vit-Fe Fumarate-FA (PRENATAL MULTIVITAMIN) TABS tablet Take  1 tablet by mouth daily.    12/22/2017 at Unknown time    Review of Systems  Constitutional: Negative for fever.  Gastrointestinal: Negative for abdominal pain.  Genitourinary: Positive for vaginal bleeding. Negative for vaginal discharge.   Physical Exam   Blood pressure (!) 110/59, pulse 93, temperature 98.7 F (37.1 C), temperature source Oral, resp. rate 16, weight 69.9 kg, last menstrual period 05/03/2017, SpO2 100 %, unknown if currently breastfeeding.  Physical Exam  Nursing note and vitals reviewed. Constitutional: She is oriented to person, place, and time. She appears well-developed and well-nourished. No distress.  HENT:  Head: Normocephalic and atraumatic.  Cardiovascular: Normal rate.  Respiratory: Effort normal.  GI: Soft. She exhibits no distension and no mass. There is no tenderness. There is no rebound and no guarding.  Genitourinary: Uterus is enlarged (appropriate for GA). Uterus is not tender. Cervix exhibits friability. Cervix exhibits no motion tenderness and no discharge. No bleeding in the vagina. Vaginal discharge (scant, thin, white) found.  Neurological: She is alert and oriented to person, place, and time.  Skin: Skin is warm and dry. No erythema.  Psychiatric: She has a  normal mood and affect.     Results for orders placed or performed during the hospital encounter of 12/28/17 (from the past 24 hour(s))  Urinalysis, Routine w reflex microscopic     Status: Abnormal   Collection Time: 12/28/17 10:48 AM  Result Value Ref Range   Color, Urine YELLOW YELLOW   APPearance CLEAR CLEAR   Specific Gravity, Urine 1.018 1.005 - 1.030   pH 6.0 5.0 - 8.0   Glucose, UA NEGATIVE NEGATIVE mg/dL   Hgb urine dipstick NEGATIVE NEGATIVE   Bilirubin Urine NEGATIVE NEGATIVE   Ketones, ur 5 (A) NEGATIVE mg/dL   Protein, ur NEGATIVE NEGATIVE mg/dL   Nitrite NEGATIVE NEGATIVE   Leukocytes, UA NEGATIVE NEGATIVE    Fetal Monitoring: Baseline: 140 bpm Variability:  moderate Accelerations: 15 x 15 Decelerations: none Contractions: none, mild UI, resolved with rest   MAU Course  Procedures  MDM Reviewed [place] from July and August. In July had low-lying placenta, in August no placental issues and cervical length was 4.2 cm.  No bleeding on exam today. Cervix closed and thick.   Assessment and Plan  A: SIUP at [redacted]w[redacted]d Spotting in pregnancy, third trimester   P:  Discharge home Bleeding precautions and preterm labor precautions discussed Pelvic rest advised  No heavy lifting or strenuous activity  Work restrictions given Patient advised to follow-up with [facility] OB/GYN as scheduled or sooner if symptoms worsen Patient may return to MAU as needed or if her condition were to change or worsen  Vonzella Nipple, PA-C 12/28/2017, 11:32 AM

## 2018-01-12 ENCOUNTER — Ambulatory Visit (HOSPITAL_COMMUNITY)
Admission: RE | Admit: 2018-01-12 | Discharge: 2018-01-12 | Disposition: A | Discharge status: Home / Self Care | Payer: 59 | Source: Ambulatory Visit | Attending: Obstetrics | Admitting: Obstetrics

## 2018-01-12 ENCOUNTER — Encounter (HOSPITAL_COMMUNITY): Payer: Self-pay

## 2018-01-12 DIAGNOSIS — Z369 Encounter for antenatal screening, unspecified: Secondary | ICD-10-CM | POA: Diagnosis not present

## 2018-01-12 DIAGNOSIS — O283 Abnormal ultrasonic finding on antenatal screening of mother: Secondary | ICD-10-CM

## 2018-01-12 DIAGNOSIS — O359XX0 Maternal care for (suspected) fetal abnormality and damage, unspecified, not applicable or unspecified: Secondary | ICD-10-CM | POA: Diagnosis not present

## 2018-01-12 DIAGNOSIS — O358XX0 Maternal care for other (suspected) fetal abnormality and damage, not applicable or unspecified: Secondary | ICD-10-CM | POA: Diagnosis not present

## 2018-01-12 DIAGNOSIS — O35EXX0 Maternal care for other (suspected) fetal abnormality and damage, fetal genitourinary anomalies, not applicable or unspecified: Secondary | ICD-10-CM

## 2018-01-12 DIAGNOSIS — Z3A36 36 weeks gestation of pregnancy: Secondary | ICD-10-CM | POA: Insufficient documentation

## 2018-01-12 DIAGNOSIS — O4443 Low lying placenta NOS or without hemorrhage, third trimester: Secondary | ICD-10-CM

## 2018-01-12 DIAGNOSIS — Z113 Encounter for screening for infections with a predominantly sexual mode of transmission: Secondary | ICD-10-CM | POA: Diagnosis not present

## 2018-01-12 DIAGNOSIS — Z3480 Encounter for supervision of other normal pregnancy, unspecified trimester: Secondary | ICD-10-CM | POA: Diagnosis not present

## 2018-01-18 DIAGNOSIS — Z369 Encounter for antenatal screening, unspecified: Secondary | ICD-10-CM | POA: Diagnosis not present

## 2018-01-26 DIAGNOSIS — Z369 Encounter for antenatal screening, unspecified: Secondary | ICD-10-CM | POA: Diagnosis not present

## 2018-01-28 ENCOUNTER — Telehealth (HOSPITAL_COMMUNITY): Payer: Self-pay | Admitting: *Deleted

## 2018-01-28 ENCOUNTER — Other Ambulatory Visit: Payer: Self-pay | Admitting: Obstetrics and Gynecology

## 2018-01-28 NOTE — Telephone Encounter (Signed)
Preadmission screen  

## 2018-02-01 ENCOUNTER — Inpatient Hospital Stay (HOSPITAL_COMMUNITY)
Admission: AD | Admit: 2018-02-01 | Discharge: 2018-02-03 | DRG: 807 | Disposition: A | Discharge status: Home / Self Care | Payer: 59 | Attending: Obstetrics and Gynecology | Admitting: Obstetrics and Gynecology

## 2018-02-01 ENCOUNTER — Inpatient Hospital Stay (HOSPITAL_COMMUNITY): Payer: 59 | Admitting: Anesthesiology

## 2018-02-01 ENCOUNTER — Other Ambulatory Visit: Payer: Self-pay

## 2018-02-01 ENCOUNTER — Encounter (HOSPITAL_COMMUNITY): Payer: Self-pay

## 2018-02-01 VITALS — BP 93/56 | HR 79 | Temp 97.8°F | Resp 16 | Ht 66.0 in | Wt 164.0 lb

## 2018-02-01 DIAGNOSIS — Z3A39 39 weeks gestation of pregnancy: Secondary | ICD-10-CM

## 2018-02-01 DIAGNOSIS — O9902 Anemia complicating childbirth: Secondary | ICD-10-CM | POA: Diagnosis present

## 2018-02-01 DIAGNOSIS — O358XX0 Maternal care for other (suspected) fetal abnormality and damage, not applicable or unspecified: Secondary | ICD-10-CM | POA: Diagnosis present

## 2018-02-01 DIAGNOSIS — Z3490 Encounter for supervision of normal pregnancy, unspecified, unspecified trimester: Secondary | ICD-10-CM

## 2018-02-01 DIAGNOSIS — D649 Anemia, unspecified: Secondary | ICD-10-CM | POA: Diagnosis present

## 2018-02-01 DIAGNOSIS — Z23 Encounter for immunization: Secondary | ICD-10-CM | POA: Diagnosis not present

## 2018-02-01 LAB — CBC
HCT: 36.1 % (ref 36.0–46.0)
Hemoglobin: 11.9 g/dL — ABNORMAL LOW (ref 12.0–15.0)
MCH: 31.2 pg (ref 26.0–34.0)
MCHC: 33 g/dL (ref 30.0–36.0)
MCV: 94.5 fL (ref 80.0–100.0)
NRBC: 0 % (ref 0.0–0.2)
PLATELETS: 135 10*3/uL — AB (ref 150–400)
RBC: 3.82 MIL/uL — ABNORMAL LOW (ref 3.87–5.11)
RDW: 17.3 % — AB (ref 11.5–15.5)
WBC: 6.6 10*3/uL (ref 4.0–10.5)

## 2018-02-01 LAB — TYPE AND SCREEN
ABO/RH(D): O POS
Antibody Screen: NEGATIVE

## 2018-02-01 LAB — ABO/RH: ABO/RH(D): O POS

## 2018-02-01 MED ORDER — ACETAMINOPHEN 325 MG PO TABS
650.0000 mg | ORAL_TABLET | ORAL | Status: DC | PRN
  Filled 2018-02-01: qty 2

## 2018-02-01 MED ORDER — INFLUENZA VAC SPLIT QUAD 0.5 ML IM SUSY
0.5000 mL | PREFILLED_SYRINGE | INTRAMUSCULAR | Status: AC
  Administered 2018-02-02: 0.5 mL via INTRAMUSCULAR
  Filled 2018-02-01: qty 0.5

## 2018-02-01 MED ORDER — PHENYLEPHRINE 40 MCG/ML (10ML) SYRINGE FOR IV PUSH (FOR BLOOD PRESSURE SUPPORT)
80.0000 ug | PREFILLED_SYRINGE | INTRAVENOUS | Status: DC | PRN
  Filled 2018-02-01: qty 10
  Filled 2018-02-01: qty 5

## 2018-02-01 MED ORDER — DIPHENHYDRAMINE HCL 50 MG/ML IJ SOLN: 12.5000 mg | INTRAMUSCULAR | Status: DC | PRN

## 2018-02-01 MED ORDER — SOD CITRATE-CITRIC ACID 500-334 MG/5ML PO SOLN: 30.0000 mL | ORAL | Status: DC | PRN

## 2018-02-01 MED ORDER — WITCH HAZEL-GLYCERIN EX PADS: 1.0000 "application " | MEDICATED_PAD | CUTANEOUS | Status: DC | PRN

## 2018-02-01 MED ORDER — SIMETHICONE 80 MG PO CHEW: 80.0000 mg | CHEWABLE_TABLET | ORAL | Status: DC | PRN

## 2018-02-01 MED ORDER — FENTANYL 2.5 MCG/ML BUPIVACAINE 1/10 % EPIDURAL INFUSION (WH - ANES)
14.0000 mL/h | INTRAMUSCULAR | Status: DC | PRN
  Administered 2018-02-01 (×2): 14 mL/h via EPIDURAL
  Filled 2018-02-01: qty 100

## 2018-02-01 MED ORDER — EPHEDRINE 5 MG/ML INJ
10.0000 mg | INTRAVENOUS | Status: DC | PRN
  Filled 2018-02-01: qty 2

## 2018-02-01 MED ORDER — MISOPROSTOL 25 MCG QUARTER TABLET
25.0000 ug | ORAL_TABLET | ORAL | Status: DC | PRN
  Filled 2018-02-01: qty 1

## 2018-02-01 MED ORDER — MEASLES, MUMPS & RUBELLA VAC ~~LOC~~ INJ: 0.5000 mL | INJECTION | Freq: Once | SUBCUTANEOUS | Status: DC

## 2018-02-01 MED ORDER — COCONUT OIL OIL: 1.0000 "application " | TOPICAL_OIL | Status: DC | PRN

## 2018-02-01 MED ORDER — OXYTOCIN 40 UNITS IN LACTATED RINGERS INFUSION - SIMPLE MED: 2.5000 [IU]/h | INTRAVENOUS | Status: DC

## 2018-02-01 MED ORDER — TERBUTALINE SULFATE 1 MG/ML IJ SOLN
0.2500 mg | Freq: Once | INTRAMUSCULAR | Status: AC | PRN
  Administered 2018-02-01: 0.25 mg via SUBCUTANEOUS

## 2018-02-01 MED ORDER — TERBUTALINE SULFATE 1 MG/ML IJ SOLN
0.2500 mg | Freq: Once | INTRAMUSCULAR | Status: DC | PRN
  Filled 2018-02-01: qty 1

## 2018-02-01 MED ORDER — LACTATED RINGERS IV SOLN
INTRAVENOUS | Status: DC
  Administered 2018-02-01 (×3): via INTRAVENOUS

## 2018-02-01 MED ORDER — IBUPROFEN 600 MG PO TABS
600.0000 mg | ORAL_TABLET | Freq: Four times a day (QID) | ORAL | Status: DC
  Administered 2018-02-02 – 2018-02-03 (×8): 600 mg via ORAL
  Filled 2018-02-01 (×8): qty 1

## 2018-02-01 MED ORDER — OXYTOCIN BOLUS FROM INFUSION: 500.0000 mL | Freq: Once | INTRAVENOUS | Status: DC

## 2018-02-01 MED ORDER — ONDANSETRON HCL 4 MG/2ML IJ SOLN
4.0000 mg | Freq: Four times a day (QID) | INTRAMUSCULAR | Status: DC | PRN
  Administered 2018-02-01: 4 mg via INTRAVENOUS
  Filled 2018-02-01: qty 2

## 2018-02-01 MED ORDER — LACTATED RINGERS IV SOLN: 500.0000 mL | Freq: Once | INTRAVENOUS | Status: DC

## 2018-02-01 MED ORDER — ONDANSETRON HCL 4 MG PO TABS: 4.0000 mg | ORAL_TABLET | ORAL | Status: DC | PRN

## 2018-02-01 MED ORDER — ZOLPIDEM TARTRATE 5 MG PO TABS: 5.0000 mg | ORAL_TABLET | Freq: Every evening | ORAL | Status: DC | PRN

## 2018-02-01 MED ORDER — SENNOSIDES-DOCUSATE SODIUM 8.6-50 MG PO TABS
2.0000 | ORAL_TABLET | ORAL | Status: DC
  Administered 2018-02-02 (×2): 2 via ORAL
  Filled 2018-02-01 (×3): qty 2

## 2018-02-01 MED ORDER — METHYLERGONOVINE MALEATE 0.2 MG/ML IJ SOLN
INTRAMUSCULAR | Status: AC
  Administered 2018-02-01: 0.2 mg
  Filled 2018-02-01: qty 1

## 2018-02-01 MED ORDER — TETANUS-DIPHTH-ACELL PERTUSSIS 5-2.5-18.5 LF-MCG/0.5 IM SUSP
0.5000 mL | Freq: Once | INTRAMUSCULAR | Status: AC
  Administered 2018-02-03: 0.5 mL via INTRAMUSCULAR
  Filled 2018-02-01: qty 0.5

## 2018-02-01 MED ORDER — DIBUCAINE 1 % RE OINT: 1.0000 "application " | TOPICAL_OINTMENT | RECTAL | Status: DC | PRN

## 2018-02-01 MED ORDER — PRENATAL MULTIVITAMIN CH
1.0000 | ORAL_TABLET | Freq: Every day | ORAL | Status: DC
  Administered 2018-02-02 – 2018-02-03 (×2): 1 via ORAL
  Filled 2018-02-01 (×2): qty 1

## 2018-02-01 MED ORDER — FERROUS SULFATE 325 (65 FE) MG PO TABS
325.0000 mg | ORAL_TABLET | Freq: Two times a day (BID) | ORAL | Status: DC
  Administered 2018-02-02 – 2018-02-03 (×4): 325 mg via ORAL
  Filled 2018-02-01 (×4): qty 1

## 2018-02-01 MED ORDER — BENZOCAINE-MENTHOL 20-0.5 % EX AERO
1.0000 "application " | INHALATION_SPRAY | CUTANEOUS | Status: DC | PRN
  Filled 2018-02-01: qty 56

## 2018-02-01 MED ORDER — LACTATED RINGERS IV SOLN: 500.0000 mL | INTRAVENOUS | Status: DC | PRN

## 2018-02-01 MED ORDER — OXYTOCIN 40 UNITS IN LACTATED RINGERS INFUSION - SIMPLE MED
1.0000 m[IU]/min | INTRAVENOUS | Status: DC
  Administered 2018-02-01: 2 m[IU]/min via INTRAVENOUS
  Filled 2018-02-01: qty 1000

## 2018-02-01 MED ORDER — LIDOCAINE HCL (PF) 1 % IJ SOLN
30.0000 mL | INTRAMUSCULAR | Status: DC | PRN
  Filled 2018-02-01: qty 30

## 2018-02-01 MED ORDER — PHENYLEPHRINE 40 MCG/ML (10ML) SYRINGE FOR IV PUSH (FOR BLOOD PRESSURE SUPPORT)
80.0000 ug | PREFILLED_SYRINGE | INTRAVENOUS | Status: DC | PRN
  Administered 2018-02-01: 80 ug via INTRAVENOUS
  Filled 2018-02-01: qty 5

## 2018-02-01 MED ORDER — ONDANSETRON HCL 4 MG/2ML IJ SOLN: 4.0000 mg | INTRAMUSCULAR | Status: DC | PRN

## 2018-02-01 MED ORDER — LIDOCAINE-EPINEPHRINE (PF) 2 %-1:200000 IJ SOLN
INTRAMUSCULAR | Status: DC | PRN
  Administered 2018-02-01: 3 mL via EPIDURAL
  Administered 2018-02-01: 4 mL via EPIDURAL

## 2018-02-01 MED ORDER — ACETAMINOPHEN 325 MG PO TABS
650.0000 mg | ORAL_TABLET | ORAL | Status: DC | PRN
  Administered 2018-02-02: 650 mg via ORAL

## 2018-02-01 NOTE — Anesthesia Pain Management Evaluation Note (Signed)
  CRNA Pain Management Visit Note  Patient: Lorraine Stevens, 27 y.o., female  "Hello I am a member of the anesthesia team at [facility]. We have an anesthesia team available at all times to provide care throughout the hospital, including epidural management and anesthesia for C-section. I don't know your plan for the delivery whether it a natural birth, water birth, IV sedation, nitrous supplementation, doula or epidural, but we want to meet your pain goals."   1.Was your pain managed to your expectations on prior hospitalizations?   Yes   2.What is your expectation for pain management during this hospitalization?     Epidural and IV pain meds  3.How can we help you reach that goal? *support**  Record the patient's initial score and the patient's pain goal.   Pain: 5  Pain Goal: 7 [facility] wants you to be able to say your pain was always managed very well.  Trellis Paganini 02/01/2018

## 2018-02-01 NOTE — Anesthesia Procedure Notes (Addendum)
Epidural Patient location during procedure: OB Start time: 02/01/2018 3:05 PM End time: 02/01/2018 3:20 PM  Staffing Anesthesiologist: Elmer Picker, MD Performed: anesthesiologist   Preanesthetic Checklist Completed: patient identified, pre-op evaluation, timeout performed, IV checked, risks and benefits discussed and monitors and equipment checked  Epidural Patient position: sitting Prep: site prepped and draped and DuraPrep Patient monitoring: continuous pulse ox, blood pressure, heart rate and cardiac monitor Approach: midline Location: L3-L4 Injection technique: LOR air  Needle:  Needle type: Tuohy  Needle gauge: 17 G Needle length: 9 cm Needle insertion depth: 5 cm Catheter type: closed end flexible Catheter size: 19 Gauge Catheter at skin depth: 10 cm Test dose: negative  Assessment Sensory level: T8 Events: blood not aspirated, injection not painful, no injection resistance, negative IV test and no paresthesia  Additional Notes Patient identified. Risks/Benefits/Options discussed with patient including but not limited to bleeding, infection, nerve damage, paralysis, failed block, incomplete pain control, headache, blood pressure changes, nausea, vomiting, reactions to medication both or allergic, itching and postpartum back pain. Confirmed with bedside nurse the patient's most recent platelet count. Confirmed with patient that they are not currently taking any anticoagulation, have any bleeding history or any family history of bleeding disorders. Patient expressed understanding and wished to proceed. All questions were answered. Sterile technique was used throughout the entire procedure. Please see nursing notes for vital signs. Test dose was given through epidural catheter and negative prior to continuing to dose epidural or start infusion. Warning signs of high block given to the patient including shortness of breath, tingling/numbness in hands, complete motor block,  or any concerning symptoms with instructions to call for help. Patient was given instructions on fall risk and not to get out of bed. All questions and concerns addressed with instructions to call with any issues or inadequate analgesia.  Reason for block:procedure for pain

## 2018-02-01 NOTE — Progress Notes (Signed)
Dr. Dareen Piano called to verify GBS status, per MD GBS is negative.

## 2018-02-01 NOTE — Anesthesia Preprocedure Evaluation (Signed)
Anesthesia Evaluation  Patient identified by MRN, date of birth, ID band Patient awake    Reviewed: Allergy & Precautions, NPO status , Patient's Chart, lab work & pertinent test results  Airway Mallampati: II  TM Distance: >3 FB Neck ROM: Full    Dental no notable dental hx. (+) Teeth Intact   Pulmonary neg pulmonary ROS,    Pulmonary exam normal breath sounds clear to auscultation       Cardiovascular negative cardio ROS Normal cardiovascular exam Rhythm:Regular Rate:Normal     Neuro/Psych  Headaches, negative psych ROS   GI/Hepatic negative GI ROS, Neg liver ROS,   Endo/Other  negative endocrine ROS  Renal/GU negative Renal ROS  negative genitourinary   Musculoskeletal negative musculoskeletal ROS (+)   Abdominal   Peds negative pediatric ROS (+)  Hematology  (+) Blood dyscrasia, anemia ,   Anesthesia Other Findings   Reproductive/Obstetrics (+) Pregnancy                             Anesthesia Physical Anesthesia Plan  ASA: II  Anesthesia Plan: Epidural   Post-op Pain Management:    Induction:   PONV Risk Score and Plan: Treatment may vary due to age or medical condition  Airway Management Planned: Natural Airway  Additional Equipment:   Intra-op Plan:   Post-operative Plan:   Informed Consent: I have reviewed the patients History and Physical, chart, labs and discussed the procedure including the risks, benefits and alternatives for the proposed anesthesia with the patient or authorized representative who has indicated his/her understanding and acceptance.     Plan Discussed with:   Anesthesia Plan Comments: (Patient identified. Risks, benefits, options discussed with patient including but not limited to bleeding, infection, nerve damage, paralysis, failed block, incomplete pain control, headache, blood pressure changes, nausea, vomiting, reactions to medication,  itching, and post partum back pain. Confirmed with bedside nurse the patient's most recent platelet count. Confirmed with the patient that they are not taking any anticoagulation, have any bleeding history or any family history of bleeding disorders. Patient expressed understanding and wishes to proceed. All questions were answered. )        Anesthesia Quick Evaluation

## 2018-02-01 NOTE — H&P (Signed)
Lorraine Stevens is an 27 y.o. G2P1001 [redacted]w[redacted]d black female who is admitted for an elective induction. Her pregnancy was complicated by anemia and a left hydroureter in the fetus. On last u/s it measured 5.86mm. Nl OGTT/negative GBS Chief Complaint: HPI:  Past Medical History:  Diagnosis Date  . Medical history non-contributory     Past Surgical History:  Procedure Laterality Date  . NO PAST SURGERIES    . WISDOM TOOTH EXTRACTION      Family History  Problem Relation Age of Onset  . Hypertension Father   . Stroke Maternal Grandmother    Social History:  reports that she has never smoked. She has never used smokeless tobacco. She reports that she does not drink alcohol or use drugs.  Allergies: No Known Allergies  Medications Prior to Admission  Medication Sig Dispense Refill  . ferrous sulfate (FERROUSUL) 325 (65 FE) MG tablet Take 1 tablet (325 mg total) by mouth 2 (two) times daily with a meal. (Patient not taking: Reported on 12/23/2017) 60 tablet 3  . Prenatal Vit-Fe Fumarate-FA (PRENATAL MULTIVITAMIN) TABS tablet Take 1 tablet by mouth daily.          Blood pressure 127/71, pulse 88, temperature 98.1 F (36.7 C), temperature source Oral, resp. rate 17, height 5\' 6"  (1.676 m), weight 74.4 kg, last menstrual period 05/03/2017, SpO2 100 %, unknown if currently breastfeeding. General appearance: alert and cooperative Abdomen: gravid/non tender   Lab Results  Component Value Date   WBC 6.6 02/01/2018   HGB 11.9 (L) 02/01/2018   HCT 36.1 02/01/2018   MCV 94.5 02/01/2018   PLT 135 (L) 02/01/2018   Lab Results  Component Value Date   PREGTESTUR POSITIVE (A) 06/14/2017    Patient Active Problem List   Diagnosis Date Noted  . [redacted] weeks gestation of pregnancy 02/01/2018  . Pregnancy 12/23/2017  . Headache in pregnancy, antepartum, third trimester 11/27/2017  . Indication for care in labor and delivery, antepartum 11/27/2017  . Anemia of pregnancy in third trimester  11/27/2017   IMP/ IUP at term          Left fetal hydroureter         Anemia Plan/ Admit start induction  , E 02/01/2018, 3:25 PM

## 2018-02-02 LAB — CBC
HEMATOCRIT: 28.2 % — AB (ref 36.0–46.0)
HEMOGLOBIN: 9.6 g/dL — AB (ref 12.0–15.0)
MCH: 31.8 pg (ref 26.0–34.0)
MCHC: 34 g/dL (ref 30.0–36.0)
MCV: 93.4 fL (ref 80.0–100.0)
Platelets: 104 10*3/uL — ABNORMAL LOW (ref 150–400)
RBC: 3.02 MIL/uL — ABNORMAL LOW (ref 3.87–5.11)
RDW: 17.3 % — ABNORMAL HIGH (ref 11.5–15.5)
WBC: 11.1 10*3/uL — ABNORMAL HIGH (ref 4.0–10.5)
nRBC: 0 % (ref 0.0–0.2)

## 2018-02-02 LAB — RPR: RPR Ser Ql: NONREACTIVE

## 2018-02-02 NOTE — Lactation Note (Signed)
This note was copied from a baby's chart. Lactation Consultation Note  Patient Name: Lorraine Stevens [id number] Date: 02/02/2018 Reason for consult: Initial assessment;Term   Initial consult with mom of 26 hour old infant. Infant with 2 BF for 40-50 minutes, 1 BF attempt, formula x 2 of 12 cc via bottle, 2 voids and 3 stools since birth. Infant asleep in mom's bed.   Mom reports she would like to switch to formula feeding as she feels infant is not getting enough. Reviewed supply and demand and milk coming to volume. Mom reports she has been able to hand express colostrum. Reviewed benefits of BF to mother and infant.   Offered mother assistance with feeding, she declined assistance. Offered mom pump to begin pumping to provide milk to infant, mom declined at this time. Enc mom to call out if she would like to pump for infant, reviewed what to expect with pumping in the early days. Dad encouraged mom to pump.   LC Brochure left with LC phone # to call with questions/concerns/assistance as needed.    Maternal Data Formula Feeding for Exclusion: Yes Reason for exclusion: Mother's choice to formula and breast feed on admission Has patient been taught Hand Expression?: Yes Does the patient have breastfeeding experience prior to this delivery?: Yes  Feeding    LATCH Score                   Interventions    Lactation Tools Discussed/Used     Consult Status Consult Status: PRN Follow-up type: Call as needed    Ed Blalock 02/02/2018, 1:11 PM

## 2018-02-02 NOTE — Anesthesia Postprocedure Evaluation (Signed)
Anesthesia Post Note  Patient: Lorraine Stevens  Procedure(s) Performed: AN AD HOC LABOR EPIDURAL     Patient location during evaluation: Mother Baby Anesthesia Type: Epidural Level of consciousness: awake and alert Pain management: pain level controlled Vital Signs Assessment: post-procedure vital signs reviewed and stable Respiratory status: spontaneous breathing, nonlabored ventilation and respiratory function stable Cardiovascular status: stable Postop Assessment: no headache, no backache and epidural receding Anesthetic complications: no    Last Vitals:  Vitals:   02/02/18 0517 02/02/18 0627  BP: (!) 103/52   Pulse: 86   Resp: 16   Temp:  37.2 C  SpO2:      Last Pain:  Vitals:   02/02/18 0627  TempSrc: Axillary  PainSc: 5    Pain Goal:                 Junious Silk

## 2018-02-02 NOTE — Progress Notes (Signed)
Patient is eating, ambulating, voiding.  Pain control is good.  Vitals:   02/01/18 2200 02/02/18 0119 02/02/18 0517 02/02/18 0627  BP: 122/83 (!) 104/56 (!) 103/52   Pulse: 76 95 86   Resp: 16 18 16    Temp: 99.2 F (37.3 C) 98.9 F (37.2 C)  99 F (37.2 C)  TempSrc: Oral Oral  Axillary  SpO2: 98%     Weight:      Height:        Fundus firm Perineum without swelling.  Lab Results  Component Value Date   WBC 11.1 (H) 02/02/2018   HGB 9.6 (L) 02/02/2018   HCT 28.2 (L) 02/02/2018   MCV 93.4 02/02/2018   PLT 104 (L) 02/02/2018    --/--/O POS, O POS Performed at [facility], [address] [place], [place] [address]  (11/05 0914)/[place]  A/P Post partum day 1.  Routine care.  Expect d/c routine.    Mattia Liford A

## 2018-02-03 MED ORDER — DOCUSATE SODIUM 100 MG PO CAPS: 100.0000 mg | ORAL_CAPSULE | Freq: Two times a day (BID) | ORAL | 0 refills | Status: DC

## 2018-02-03 MED ORDER — IBUPROFEN 600 MG PO TABS: 600.0000 mg | ORAL_TABLET | Freq: Four times a day (QID) | ORAL | 0 refills | Status: DC | PRN

## 2018-02-03 NOTE — Progress Notes (Signed)
Post Partum Day 2 Subjective: no complaints, up ad lib, voiding and tolerating PO  Objective: Blood pressure 130/75, pulse 72, temperature 97.8 F (36.6 C), temperature source Oral, resp. rate 16, height 5\' 6"  (1.676 m), weight 74.4 kg, last menstrual period 05/03/2017, SpO2 100 %, unknown if currently breastfeeding.  Physical Exam:  General: alert, cooperative and appears stated age Lochia: appropriate Uterine Fundus: firm Incision: healing well DVT Evaluation: No evidence of DVT seen on physical exam.  Recent Labs    02/01/18 0850 02/02/18 0622  HGB 11.9* 9.6*  HCT 36.1 28.2*    Assessment/Plan: Discharge home  Desires neonatal circumcision, R/B/A of procedure discussed at length. Pt understands that neonatal circumcision is not considered medically necessary and is elective. The risks include, but are not limited to bleeding, infection, damage to the penis, development of scar tissue, and having to have it redone at a later date. Pt understands theses risks and wishes to proceed Baby having VCUG today, will hopefully be able to circ after     LOS: 2 days   Waynard Reeds 02/03/2018, 9:27 AM

## 2018-02-03 NOTE — Discharge Summary (Signed)
Obstetric Discharge Summary Reason for Admission: induction of labor Prenatal Procedures: ultrasound Intrapartum Procedures: spontaneous vaginal delivery Postpartum Procedures: none Complications-Operative and Postpartum: 2nd degree perineal laceration Hemoglobin  Date Value Ref Range Status  02/02/2018 9.6 (L) 12.0 - 15.0 g/dL Final   HCT  Date Value Ref Range Status  02/02/2018 28.2 (L) 36.0 - 46.0 % Final    Physical Exam:  General: alert, cooperative and appears stated age 27: appropriate Uterine Fundus: firm Incision: healing well DVT Evaluation: No evidence of DVT seen on physical exam.  Discharge Diagnoses: Term Pregnancy-delivered  Discharge Information: Date: 02/03/2018 Activity: pelvic rest Diet: routine Medications: Ibuprofen and Colace Condition: improved Instructions: refer to practice specific booklet Discharge to: home Follow-up Information    Levi Aland, MD Follow up in 4 week(s).   Specialty:  Obstetrics and Gynecology Why:  for a postpartum evaluation Contact information: [address] [place] [address] [phone]           Newborn Data: Live born female  Birth Weight: 6 lb 5.4 oz (2875 g) APGAR: 8, 9  Newborn Delivery   Birth date/time:  02/01/2018 18:37:00 Delivery type:  Vaginal, Spontaneous     Home with mother.  Waynard Reeds 02/03/2018, 9:33 AM

## 2018-02-11 ENCOUNTER — Emergency Department (HOSPITAL_COMMUNITY)
Admission: EM | Admit: 2018-02-11 | Discharge: 2018-02-11 | Disposition: A | Discharge status: Home / Self Care | Payer: 59 | Attending: Emergency Medicine | Admitting: Emergency Medicine

## 2018-02-11 ENCOUNTER — Other Ambulatory Visit: Payer: Self-pay

## 2018-02-11 ENCOUNTER — Emergency Department (HOSPITAL_COMMUNITY): Payer: 59

## 2018-02-11 ENCOUNTER — Encounter (HOSPITAL_COMMUNITY): Payer: Self-pay | Admitting: Emergency Medicine

## 2018-02-11 DIAGNOSIS — J3489 Other specified disorders of nose and nasal sinuses: Secondary | ICD-10-CM | POA: Diagnosis not present

## 2018-02-11 DIAGNOSIS — J029 Acute pharyngitis, unspecified: Secondary | ICD-10-CM | POA: Diagnosis not present

## 2018-02-11 DIAGNOSIS — R05 Cough: Secondary | ICD-10-CM | POA: Diagnosis not present

## 2018-02-11 MED ORDER — FLUTICASONE PROPIONATE 50 MCG/ACT NA SUSP: 2.0000 | Freq: Every day | NASAL | 0 refills | Status: DC

## 2018-02-11 MED ORDER — PHENOL 1.4 % MT LIQD: 1.0000 | OROMUCOSAL | 0 refills | Status: DC | PRN

## 2018-02-11 NOTE — ED Provider Notes (Signed)
[facility] EMERGENCY DEPARTMENT Provider Note   CSN: [id number] Arrival date & time: 02/11/18  0800     History   Chief Complaint Chief Complaint  Patient presents with  . Sore Throat    HPI Lorraine Stevens is a 27 y.o. female G2P2 female presents for evaluation of acute onset, progressively worsening sinus pressure, sore throat for 5 days.  She is 10 days postpartum with no associated complication during the pregnancy or delivery.  No history of pregnancy or gestational diabetes.  She states that for the past 5 days she has had a throbbing frontal headache, sinus pressure, sore throat, and cough.  Cough became productive of yellow sputum yesterday.  She denies shortness of breath or chest pain, no fevers but some subjective chills.  Symptoms improve with Sudafed.  She denies vision changes, dizziness, lightheadedness, numbness, weakness, slurred speech.  She is not breast-feeding.  The history is provided by the patient.    Past Medical History:  Diagnosis Date  . Medical history non-contributory     Patient Active Problem List   Diagnosis Date Noted  . [redacted] weeks gestation of pregnancy 02/01/2018  . Pregnancy 12/23/2017  . Headache in pregnancy, antepartum, third trimester 11/27/2017  . Indication for care in labor and delivery, antepartum 11/27/2017  . Anemia of pregnancy in third trimester 11/27/2017    Past Surgical History:  Procedure Laterality Date  . NO PAST SURGERIES    . WISDOM TOOTH EXTRACTION       OB History    Gravida  2   Para  2   Term  2   Preterm      AB      Living  2     SAB      TAB      Ectopic      Multiple  0   Live Births  2            Home Medications    Prior to Admission medications   Medication Sig Start Date End Date Taking? Authorizing Provider  docusate sodium (COLACE) 100 MG capsule Take 1 capsule (100 mg total) by mouth 2 (two) times daily. 02/03/18   Waynard Reeds, MD  fluticasone [facility])  50 MCG/ACT nasal spray Place 2 sprays into both nostrils daily. 02/11/18   Laelia Angelo A, PA-C  ibuprofen (ADVIL,MOTRIN) 600 MG tablet Take 1 tablet (600 mg total) by mouth every 6 (six) hours as needed. 02/03/18   Waynard Reeds, MD  phenol (CHLORASEPTIC) 1.4 % LIQD Use as directed 1 spray in the mouth or throat as needed for throat irritation / pain. 02/11/18   Jeanie Sewer, PA-C    Family History Family History  Problem Relation Age of Onset  . Hypertension Father   . Stroke Maternal Grandmother     Social History Social History   Tobacco Use  . Smoking status: Never Smoker  . Smokeless tobacco: Never Used  Substance Use Topics  . Alcohol use: No  . Drug use: No     Allergies   Patient has no known allergies.   Review of Systems Review of Systems  Constitutional: Positive for chills. Negative for fever.  HENT: Positive for sinus pressure and sore throat. Negative for drooling and trouble swallowing.   Eyes: Negative for photophobia and visual disturbance.  Respiratory: Positive for cough. Negative for shortness of breath.   Cardiovascular: Negative for chest pain.  Gastrointestinal: Negative for abdominal pain.  Genitourinary: Positive for vaginal  bleeding (10 days post-partum).  Neurological: Positive for headaches. Negative for weakness, light-headedness and numbness.  All other systems reviewed and are negative.    Physical Exam Updated Vital Signs BP 130/84 (BP Location: Right Arm)   Pulse 93   Temp 98.8 F (37.1 C) (Oral)   Resp 16   Ht 5\' 6"  (1.676 m)   Wt 74 kg   LMP 05/03/2017   SpO2 100%   BMI 26.33 kg/m   Physical Exam  Constitutional: She is oriented to person, place, and time. She appears well-developed and well-nourished. No distress.  HENT:  Head: Normocephalic and atraumatic.  Right Ear: Tympanic membrane and ear canal normal. No drainage or tenderness.  Left Ear: Tympanic membrane and ear canal normal. No drainage or tenderness.    Mouth/Throat: Uvula is midline and mucous membranes are normal. Posterior oropharyngeal erythema present. No posterior oropharyngeal edema. Tonsils are 2+ on the right. Tonsils are 2+ on the left. No tonsillar exudate.  Nasal septum midline, mild mucosal edema bilaterally.  No maxillary sinus tenderness but there is bilateral frontal sinus tenderness.  Posterior oropharynx with erythema mild tonsillar hypertrophy but no exudates, uvular deviation, trismus, or sublingual abnormalities.  Tolerating secretions without difficulty.  Eyes: Pupils are equal, round, and reactive to light. Conjunctivae and EOM are normal. Right eye exhibits no discharge. Left eye exhibits no discharge.  Neck: Normal range of motion. Neck supple. No JVD present. No tracheal deviation present.  Bilateral anterior cervical lymphadenopathy  Cardiovascular: Normal rate, regular rhythm and normal heart sounds.  Pulmonary/Chest: Effort normal. No respiratory distress. She exhibits no tenderness.  Globally diminished breath sounds, equal rise and fall of chest, no increased work of breathing.  Speaking in full sentences without difficulty.  Abdominal: Soft. Bowel sounds are normal. She exhibits no distension. There is no tenderness.  Musculoskeletal: She exhibits no edema.  Lymphadenopathy:    She has cervical adenopathy.  Neurological: She is alert and oriented to person, place, and time.  Mental Status:  Alert, thought content appropriate, able to give a coherent history. Speech fluent without evidence of aphasia. Able to follow 2 step commands without difficulty.  Cranial Nerves:  II:  Peripheral visual fields grossly normal, pupils equal, round, reactive to light III,IV, VI: ptosis not present, extra-ocular motions intact bilaterally  V,VII: smile symmetric, facial light touch sensation equal VIII: hearing grossly normal to voice  X: uvula elevates symmetrically  XI: bilateral shoulder shrug symmetric and strong XII:  midline tongue extension without fassiculations Motor:  Normal tone. 5/5 strength of BUE and BLE major muscle groups including strong and equal grip strength and dorsiflexion/plantar flexion Sensory: light touch normal in all extremities. Cerebellar: normal finger-to-nose with bilateral upper extremities Gait: normal gait and balance. No pronator drift.   Skin: Skin is warm and dry. No erythema.  Psychiatric: She has a normal mood and affect. Her behavior is normal.  Nursing note and vitals reviewed.    ED Treatments / Results  Labs (all labs ordered are listed, but only abnormal results are displayed) Labs Reviewed - No data to display  EKG None  Radiology Dg Chest 2 View  Result Date: 02/11/2018 CLINICAL DATA:  Onset of sore throat with swollen lymph nodes 5 days ago. Three days of cough and chest congestion. The patient is 3 days postpartum EXAM: CHEST - 2 VIEW COMPARISON:  PA and lateral chest x-ray of January 19, 2017 FINDINGS: The lungs are well-expanded. There is no focal infiltrate. There is no pleural effusion.  The heart and pulmonary vascularity are normal. The mediastinum is normal in width. The trachea is midline. The bony thorax exhibits no acute abnormality. IMPRESSION: There is no pneumonia nor other acute cardiopulmonary abnormality. Electronically Signed   By: David  [place] M.D.   On: 02/11/2018 09:33    Procedures Procedures (including critical care time)  Medications Ordered in ED Medications - No data to display   Initial Impression / Assessment and Plan / ED Course  I have reviewed the triage vital signs and the nursing notes.  Pertinent labs & imaging results that were available during my care of the patient were reviewed by me and considered in my medical decision making (see chart for details).     Patient presents with sinus pressure, sore throat, cough.  She is afebrile, vital signs are stable.  She is nontoxic in appearance.  She is 10 days  postpartum and has no complications with regards to pregnancy or delivery.  Abdomen soft and nontender.  Chest x-ray shows no evidence of pneumonia, effusion, or cardiomegaly.  Symptoms seem more infectious in etiology and given normal vital signs and normal neurologic exam I have a low suspicion of cavernous sinus thrombosis, CVA, or other preeclampsia-related pathology. Doubt strep in the absence of fever, exudates, and in the presence of cough.  No evidence of PTA, retropharyngeal abscess or other deep space neck infection.  Presentation consistent with viral URI versus sinusitis.  Discussed antibiotics are not indicated for this.  Will discharge with symptom medic treatment.  She is not breast-feeding.  Discussed strict ED return precautions. Pt verbalized understanding of and agreement with plan and is safe for discharge home at this time.   Final Clinical Impressions(s) / ED Diagnoses   Final diagnoses:  Sinus pressure  Sore throat    ED Discharge Orders         Ordered    fluticasone (FLONASE) 50 MCG/ACT nasal spray  Daily     02/11/18 0941    phenol (CHLORASEPTIC) 1.4 % LIQD  As needed     02/11/18 0941           Jeanie Sewer, PA-C 02/11/18 0946    Tilden Fossa, MD 02/11/18 1021

## 2018-02-11 NOTE — ED Triage Notes (Addendum)
Pt arrives to ED from home with complaints of sore throat, swollen lymph nodes and starting five days ago. Pt reports that she is 10 days postpartum. Pt placed in position of comfort with bed locked and lowered, call bell in reach.

## 2018-02-11 NOTE — ED Notes (Signed)
Patient verbalizes understanding of discharge instructions. Opportunity for questioning and answers were provided. Armband removed by staff, pt discharged from ED. Pt ambulatory to lobby.  

## 2018-02-11 NOTE — Discharge Instructions (Addendum)
Your chest x-ray did not show any sign of pneumonia, enlarged heart, or fluid in the lungs.    Drink plenty of fluids and get plenty of rest. Gargle warm salt water and spit it out for sore throat. May also use cough drops, warm teas, chloraseptic spray etc. Take flonase to decrease nasal congestion.  You can also use over-the-counter allergy medicines for nasal congestion and scratchy throat. Alternate 600 mg of ibuprofen and [id number] mg of Tylenol every 3 hours as needed for pain. Do not exceed 4000 mg of Tylenol daily.   Followup with your primary care doctor in 5-7 days for recheck of ongoing symptoms. Return to emergency department for emergent changing or worsening of symptoms such as throat tightness, facial swelling, fever not controlled by ibuprofen or Tylenol,difficulty breathing, or chest pain.

## 2018-03-14 DIAGNOSIS — Z3042 Encounter for surveillance of injectable contraceptive: Secondary | ICD-10-CM | POA: Diagnosis not present

## 2018-03-14 DIAGNOSIS — Z124 Encounter for screening for malignant neoplasm of cervix: Secondary | ICD-10-CM | POA: Diagnosis not present

## 2018-03-14 DIAGNOSIS — Z3202 Encounter for pregnancy test, result negative: Secondary | ICD-10-CM | POA: Diagnosis not present

## 2018-05-21 ENCOUNTER — Other Ambulatory Visit: Payer: Self-pay

## 2018-05-21 ENCOUNTER — Emergency Department (HOSPITAL_BASED_OUTPATIENT_CLINIC_OR_DEPARTMENT_OTHER)
Admission: EM | Admit: 2018-05-21 | Discharge: 2018-05-21 | Disposition: A | Discharge status: Home / Self Care | Payer: 59 | Attending: Emergency Medicine | Admitting: Emergency Medicine

## 2018-05-21 ENCOUNTER — Encounter (HOSPITAL_BASED_OUTPATIENT_CLINIC_OR_DEPARTMENT_OTHER): Payer: Self-pay | Admitting: Emergency Medicine

## 2018-05-21 ENCOUNTER — Emergency Department (HOSPITAL_BASED_OUTPATIENT_CLINIC_OR_DEPARTMENT_OTHER): Payer: 59

## 2018-05-21 DIAGNOSIS — S161XXA Strain of muscle, fascia and tendon at neck level, initial encounter: Secondary | ICD-10-CM | POA: Diagnosis not present

## 2018-05-21 DIAGNOSIS — R51 Headache: Secondary | ICD-10-CM | POA: Insufficient documentation

## 2018-05-21 DIAGNOSIS — M546 Pain in thoracic spine: Secondary | ICD-10-CM | POA: Diagnosis not present

## 2018-05-21 DIAGNOSIS — Y92410 Unspecified street and highway as the place of occurrence of the external cause: Secondary | ICD-10-CM | POA: Diagnosis not present

## 2018-05-21 DIAGNOSIS — Y999 Unspecified external cause status: Secondary | ICD-10-CM | POA: Diagnosis not present

## 2018-05-21 DIAGNOSIS — S199XXA Unspecified injury of neck, initial encounter: Secondary | ICD-10-CM | POA: Diagnosis present

## 2018-05-21 DIAGNOSIS — Z79899 Other long term (current) drug therapy: Secondary | ICD-10-CM | POA: Insufficient documentation

## 2018-05-21 DIAGNOSIS — R519 Headache, unspecified: Secondary | ICD-10-CM

## 2018-05-21 DIAGNOSIS — Y939 Activity, unspecified: Secondary | ICD-10-CM | POA: Diagnosis not present

## 2018-05-21 DIAGNOSIS — M542 Cervicalgia: Secondary | ICD-10-CM | POA: Diagnosis not present

## 2018-05-21 MED ORDER — ACETAMINOPHEN 500 MG PO TABS
1000.0000 mg | ORAL_TABLET | Freq: Once | ORAL | Status: AC
  Administered 2018-05-21: 1000 mg via ORAL
  Filled 2018-05-21: qty 2

## 2018-05-21 MED ORDER — METHOCARBAMOL 500 MG PO TABS: 500.0000 mg | ORAL_TABLET | Freq: Two times a day (BID) | ORAL | 0 refills | Status: DC

## 2018-05-21 NOTE — Discharge Instructions (Signed)
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
## 2018-06-03 DIAGNOSIS — Z3042 Encounter for surveillance of injectable contraceptive: Secondary | ICD-10-CM | POA: Diagnosis not present

## 2018-08-29 DIAGNOSIS — Z3042 Encounter for surveillance of injectable contraceptive: Secondary | ICD-10-CM | POA: Diagnosis not present

## 2018-11-21 DIAGNOSIS — Z3042 Encounter for surveillance of injectable contraceptive: Secondary | ICD-10-CM | POA: Diagnosis not present

## 2019-02-13 DIAGNOSIS — Z3042 Encounter for surveillance of injectable contraceptive: Secondary | ICD-10-CM | POA: Diagnosis not present

## 2019-07-31 ENCOUNTER — Other Ambulatory Visit: Payer: Self-pay

## 2019-07-31 ENCOUNTER — Encounter: Payer: Self-pay | Admitting: *Deleted

## 2019-07-31 ENCOUNTER — Emergency Department
Admission: EM | Admit: 2019-07-31 | Discharge: 2019-07-31 | Disposition: A | Discharge status: Home / Self Care | Payer: Medicaid Other | Attending: Emergency Medicine | Admitting: Emergency Medicine

## 2019-07-31 DIAGNOSIS — Z79899 Other long term (current) drug therapy: Secondary | ICD-10-CM | POA: Insufficient documentation

## 2019-07-31 DIAGNOSIS — L0201 Cutaneous abscess of face: Secondary | ICD-10-CM

## 2019-07-31 DIAGNOSIS — L03211 Cellulitis of face: Secondary | ICD-10-CM | POA: Insufficient documentation

## 2019-07-31 MED ORDER — SULFAMETHOXAZOLE-TRIMETHOPRIM 800-160 MG PO TABS: 1.0000 | ORAL_TABLET | Freq: Two times a day (BID) | ORAL | 0 refills | Status: DC

## 2019-07-31 MED ORDER — SULFAMETHOXAZOLE-TRIMETHOPRIM 800-160 MG PO TABS
1.0000 | ORAL_TABLET | Freq: Once | ORAL | Status: AC
  Administered 2019-07-31: 1 via ORAL
  Filled 2019-07-31: qty 1

## 2019-07-31 MED ORDER — CEPHALEXIN 500 MG PO CAPS: 500.0000 mg | ORAL_CAPSULE | Freq: Three times a day (TID) | ORAL | 0 refills | Status: AC

## 2019-07-31 MED ORDER — CEPHALEXIN 500 MG PO CAPS
500.0000 mg | ORAL_CAPSULE | Freq: Once | ORAL | Status: AC
  Administered 2019-07-31: 20:00:00 500 mg via ORAL
  Filled 2019-07-31: qty 1
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
## 2019-07-31 NOTE — Discharge Instructions (Addendum)
Follow-up with your regular doctor if not better in 3 days.  Return emergency department if worsening.  Take your medications as prescribed.  Take a probiotic to prevent diarrhea and yeast.  Apply a warm compress to the area.  The pus will continue to drain.
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
## 2019-07-31 NOTE — ED Notes (Signed)
See triage note. Pt with evident inflammation to right cheek just below right eye. Darl Pikes PA at patient bedside. Purulent drainage actively draining from site with minimal manipulation.

## 2019-12-28 IMAGING — US US OB COMP LESS 14 WK
1 series · 15 of 28 positions shown · non-contrast
Comparison: None.

CLINICAL DATA: Cramping. Estimated gestational age by LMP is 8
weeks 4 days. Quantitative beta HCG is pending

EXAM:
OBSTETRIC <14 WK ULTRASOUND
TECHNIQUE: Transabdominal ultrasound was performed for evaluation of the
gestation as well as the maternal uterus and adnexal regions.

[Series 1: us ob comp less 14 wk · 15 of 30 slices shown]
[im 1/30]
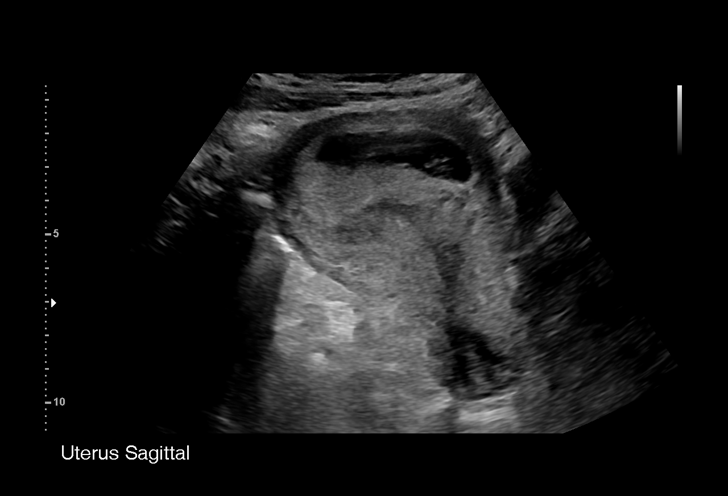
[im 3/30]
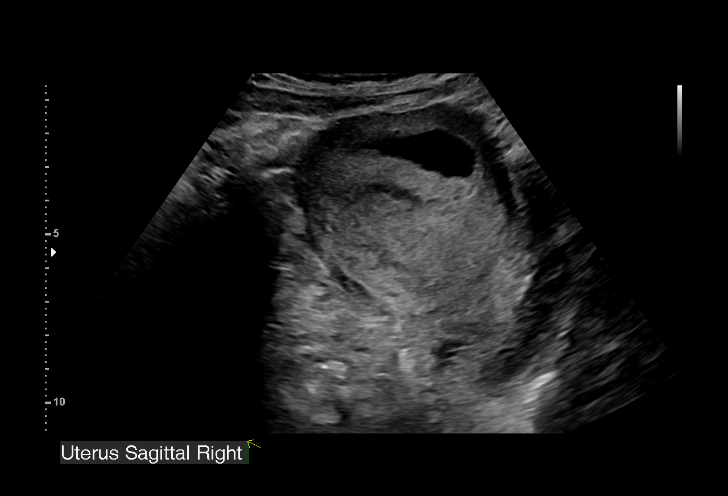
[im 5/30]
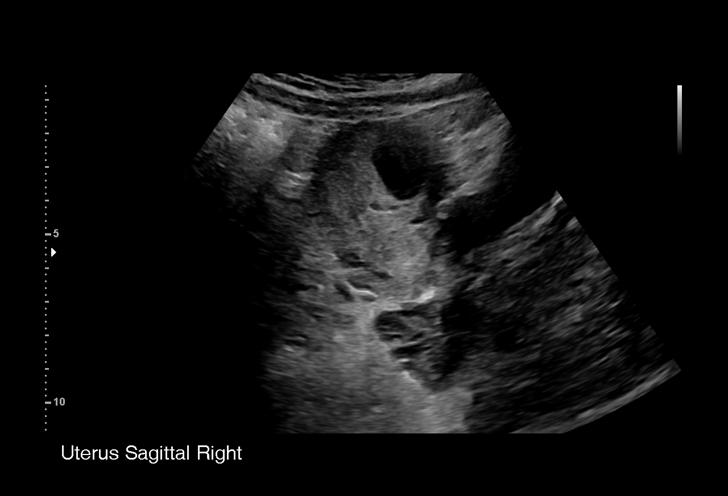
[im 7/30]
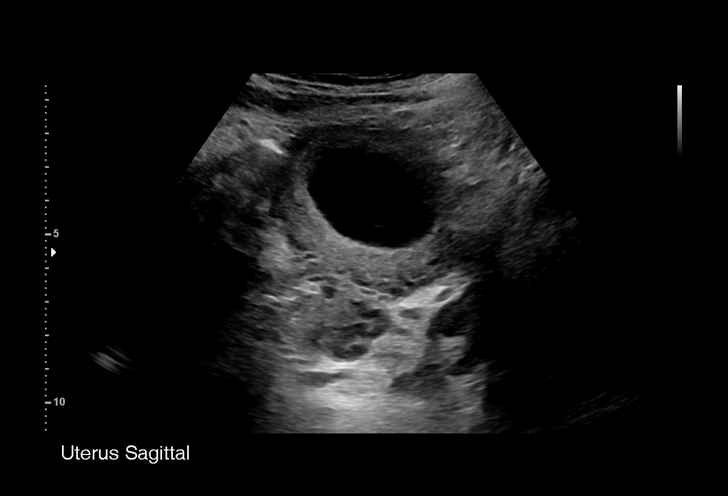
[im 9/30]
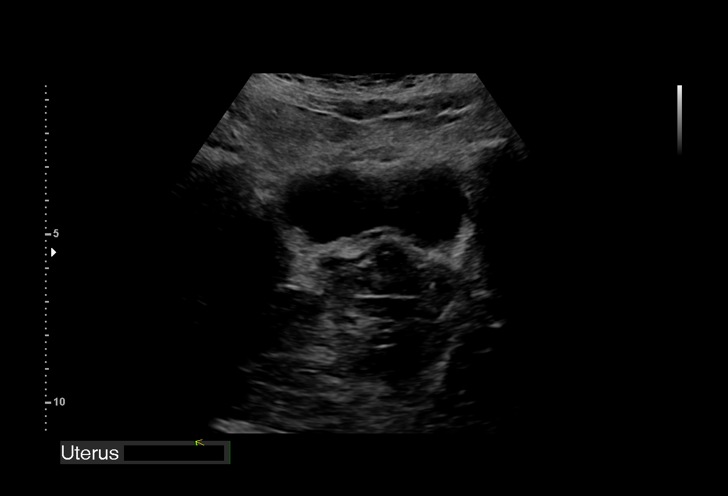
[im 11/30]
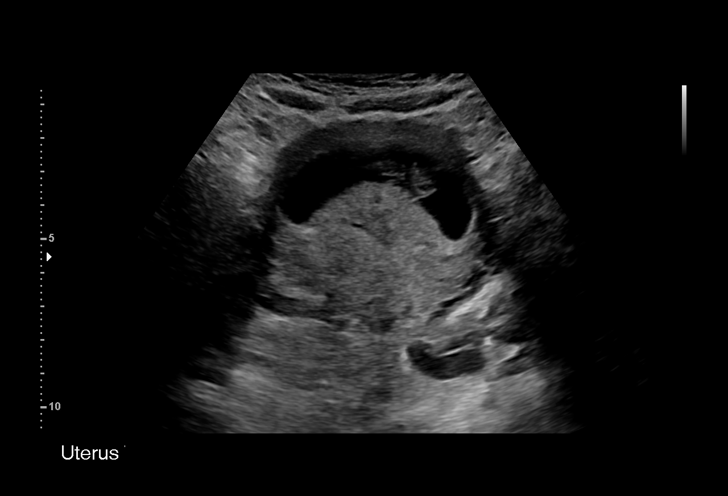
[im 13/30]
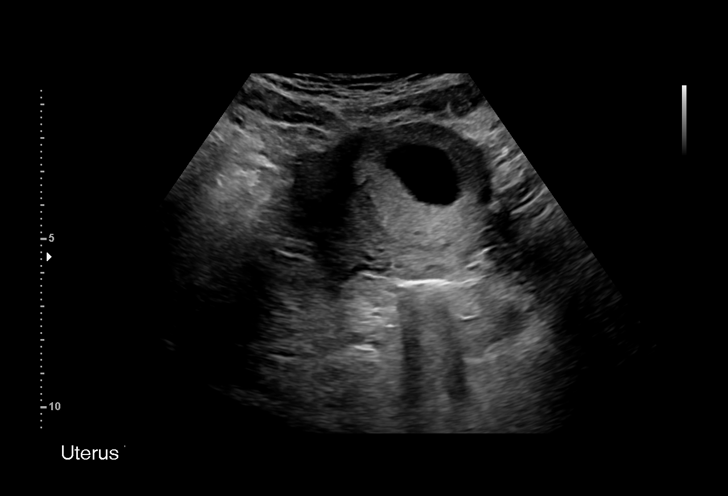
[im 16/30]
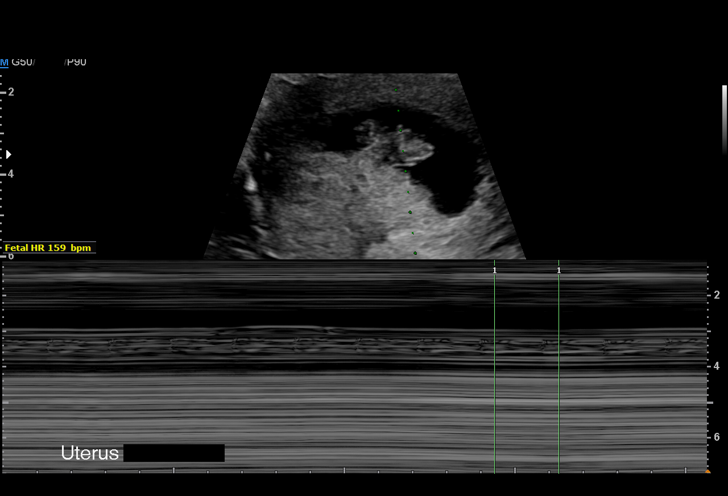
[im 17/30]
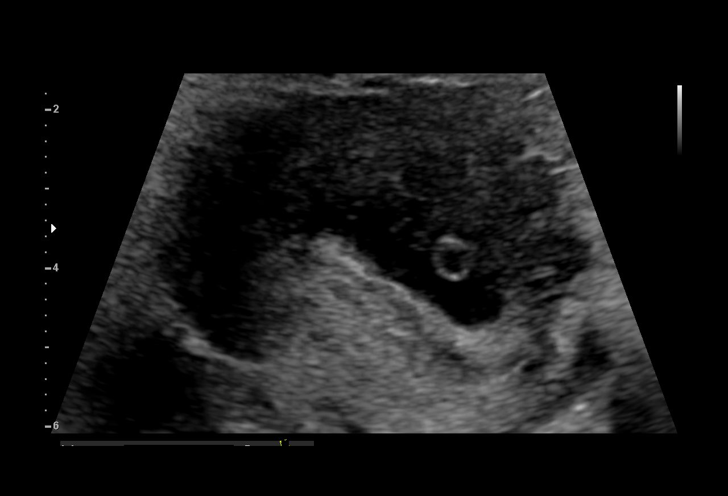
[im 19/30]
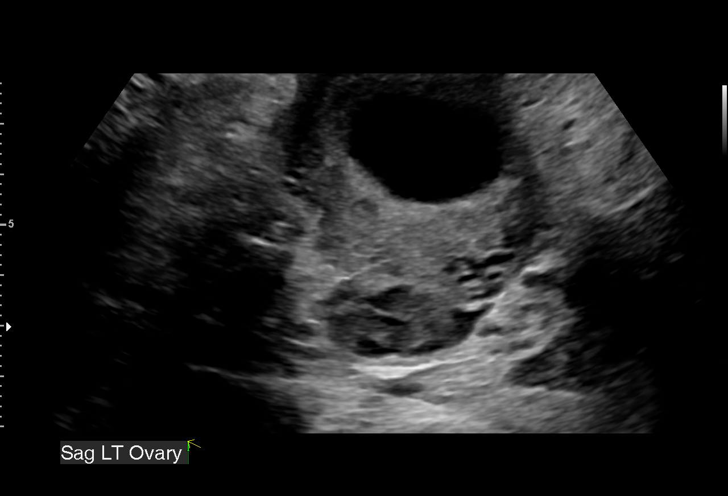
[im 21/30]
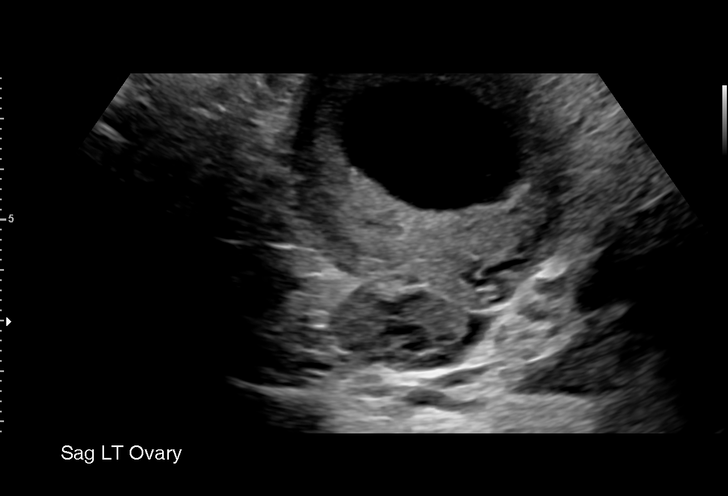
[im 23/30]
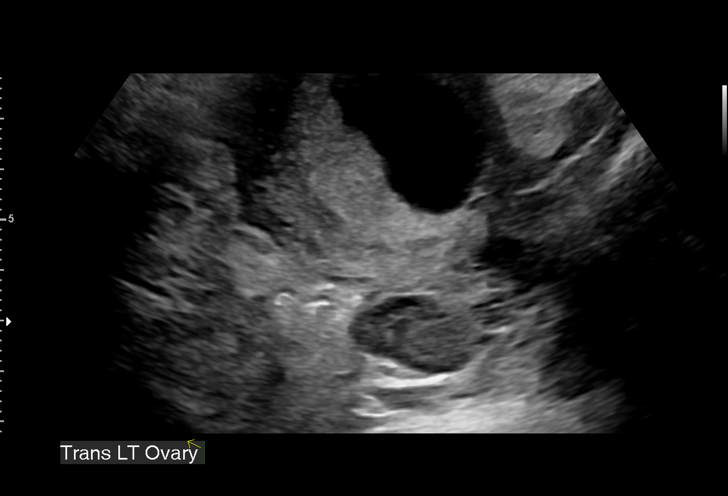
[im 25/30]
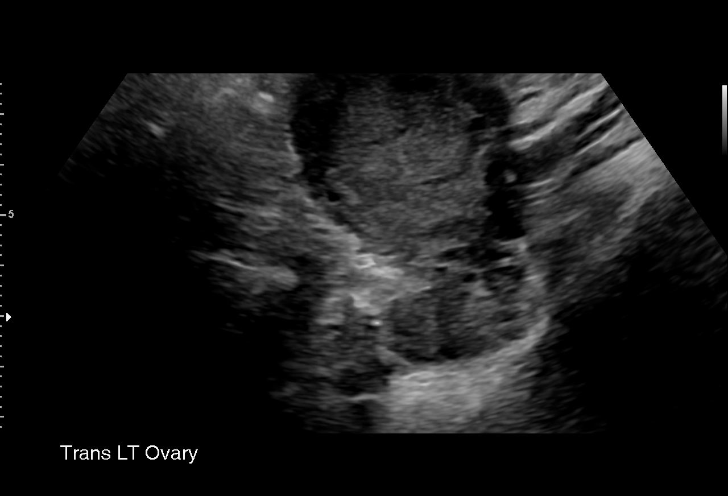
[im 27/30]
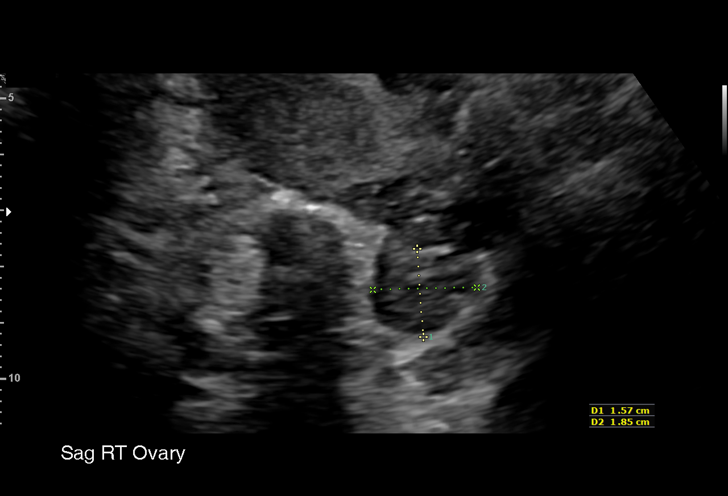
[im 30/30]
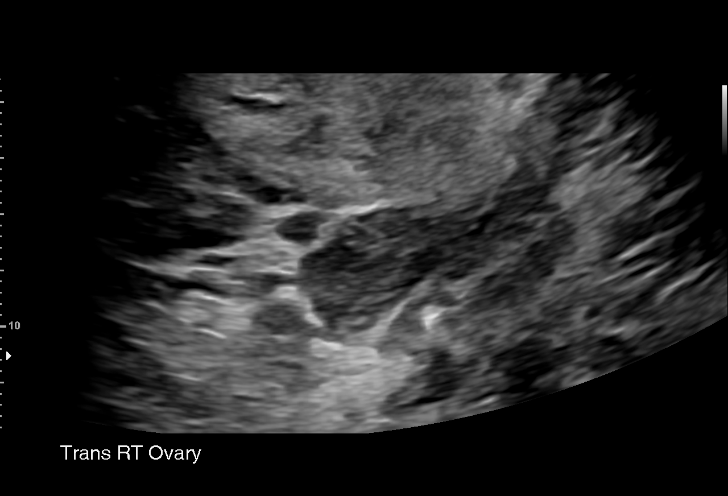

[15 of 28 positions shown; findings below may reference images not displayed]

FINDINGS: Intrauterine gestational sac: A single intrauterine gestational sac
is identified.

Yolk sac:  Yolk sac is present.

Embryo:  Fetal pole is present.

Cardiac Activity: Fetal cardiac activity is observed.

Heart Rate: 159 bpm

CRL: 19.8 mm   8 w 3 d                  US EDC: 02/08/2018

Subchorionic hemorrhage:  None visualized.

Maternal uterus/adnexae: Uterus is anteverted. No myometrial masses
are identified. Both ovaries are visualized and appear normal. No
abnormal adnexal masses are seen. No free fluid in the pelvis.
IMPRESSION: A single intrauterine pregnancy is identified. Estimated gestational
age by crown-rump length is 8 weeks 3 days. No acute complication is
identified sonographically.

## 2020-05-14 IMAGING — US US MFM OB FOLLOW-UP
1 series · 13 of 28 positions shown · non-contrast
Comparison: none

[Series 1: us mfm ob follow-up · 62 acquisitions, 13 frames shown]
[im 3/62]
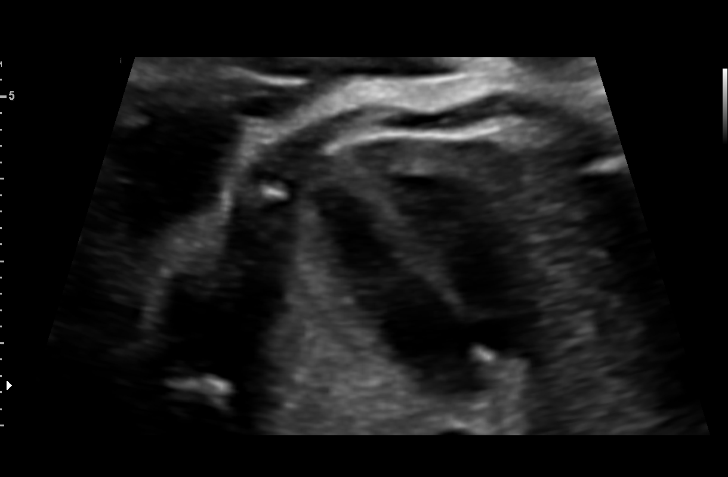
[im 7/62]
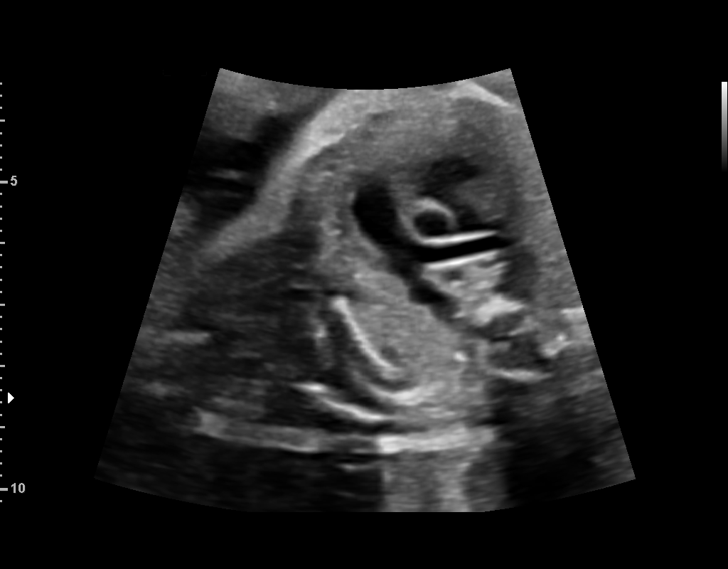
[im 12/62]
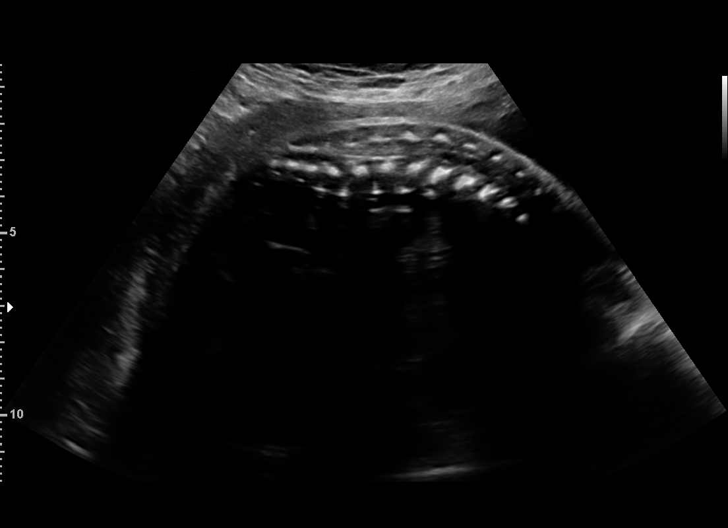
[im 16/62]
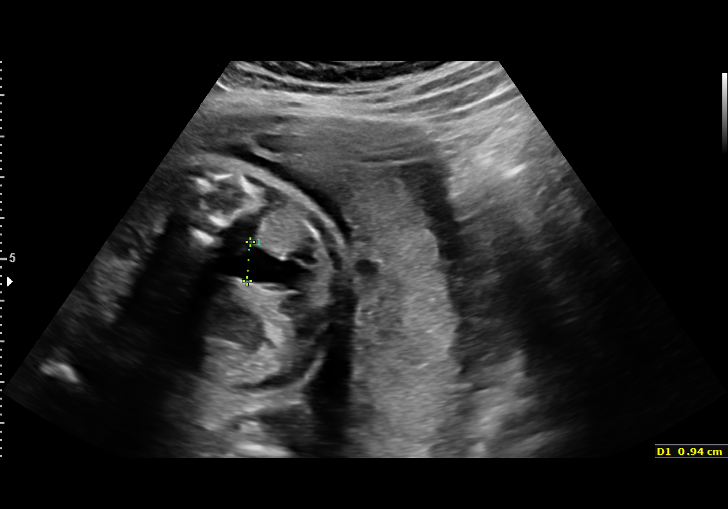
[im 21/62]
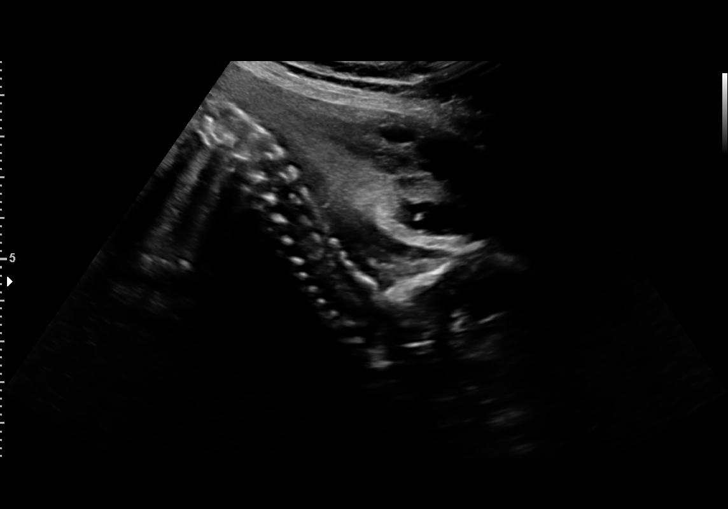
[im 25/62]
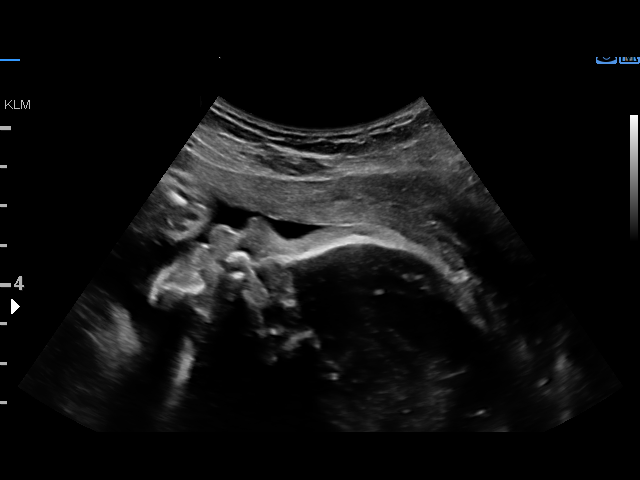
[im 32/62]
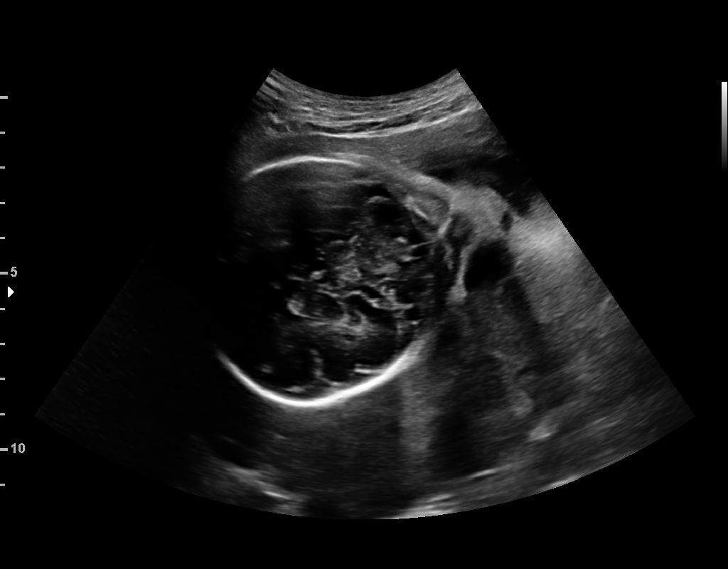
[im 37/62]
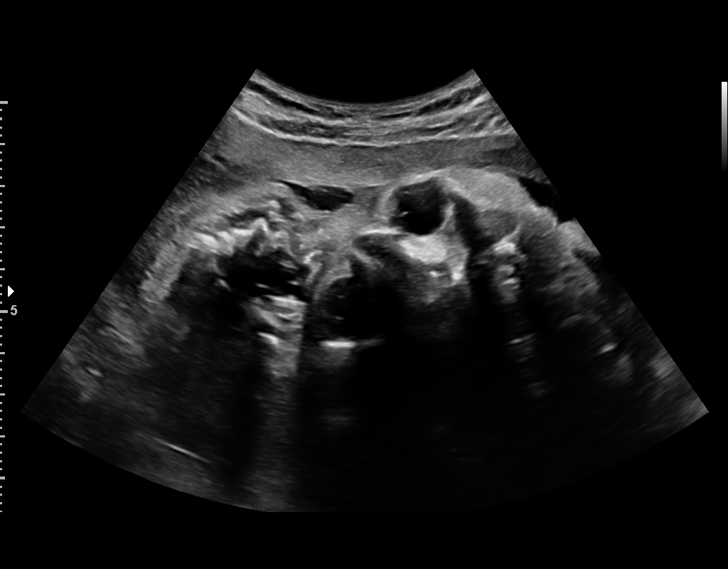
[im 41/62]
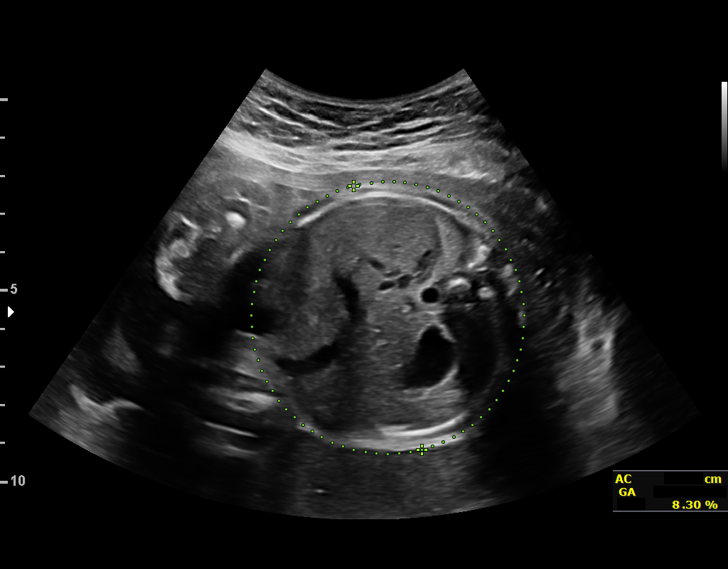
[im 46/62]
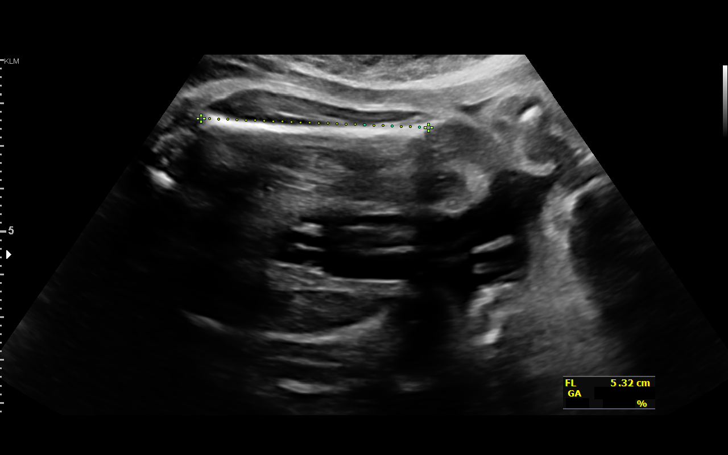
[im 50/62]
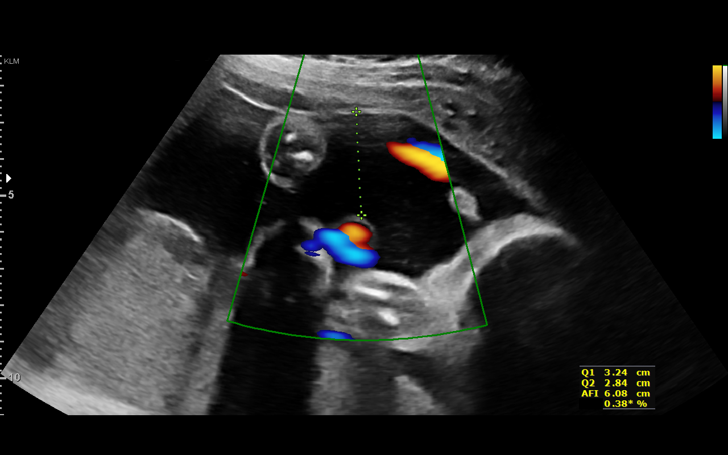
[im 55/62]
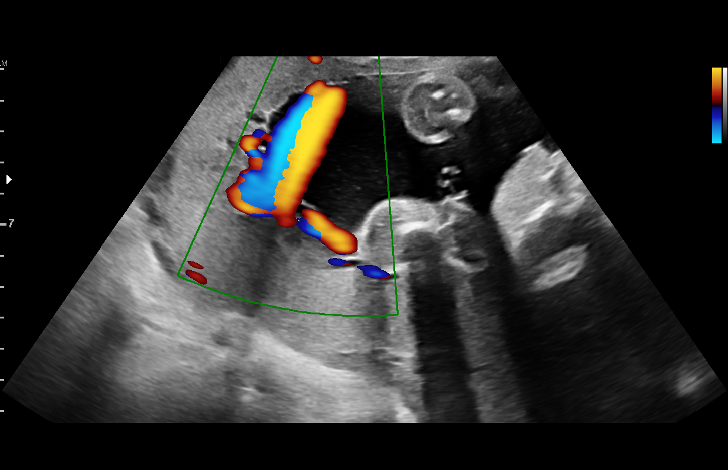
[im 59/62]
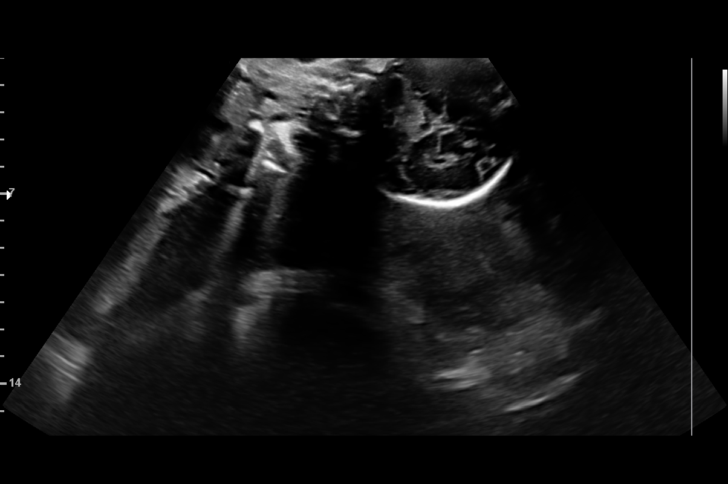

[13 of 28 positions shown; findings below may reference images not displayed]

OBGYN

Indications

28 weeks gestation of pregnancy
Pyelectasis of fetus on prenatal ultrasound
Low lying placenta, antepartum
Antenatal follow-up for nonvisualized fetal
anatomy
Fetal abnormality - other known (absent
nasal bone) (low risk NIPS)
Fetal Evaluation

Num Of Fetuses:         1
Fetal Heart Rate(bpm):  143
Cardiac Activity:       Observed
Presentation:           Cephalic
Placenta:               Posterior
P. Cord Insertion:      Previously Visualized

Amniotic Fluid
AFI FV:      Within normal limits

AFI Sum(cm)     %Tile       Largest Pocket(cm)
14.49           49

RUQ(cm)       RLQ(cm)       LUQ(cm)        LLQ(cm)
3.24
Biometry
BPD:      67.6  mm     G. Age:  27w 2d         10  %    CI:        74.44   %    70 - 86
FL/HC:      21.2   %    18.8 -
HC:      248.7  mm     G. Age:  27w 0d        < 3  %    HC/AC:      1.09        1.05 -
AC:      228.3  mm     G. Age:  27w 2d         15  %    FL/BPD:     78.1   %    71 - 87
FL:       52.8  mm     G. Age:  28w 1d         29  %    FL/AC:      23.1   %    20 - 24

Est. FW:    0572  gm      2 lb 6 oz     34  %
OB History

Gravidity:    2         Term:   1        Prem:   0        SAB:   0
TOP:          0       Ectopic:  0        Living: 1
Gestational Age

LMP:           28w 2d        Date:  05/03/17                 EDD:   02/07/18
U/S Today:     27w 3d                                        EDD:   02/13/18
Best:          28w 2d     Det. By:  LMP  (05/03/17)          EDD:   02/07/18
Anatomy

Cranium:               Appears normal         Aortic Arch:            Previously seen
Cavum:                 Appears normal         Ductal Arch:            Appears normal
Ventricles:            Appears normal         Diaphragm:              Appears normal
Choroid Plexus:        Appears normal         Stomach:                Appears normal, left
sided
Cerebellum:            Appears normal         Abdomen:                Appears normal
Posterior Fossa:       Previously seen        Abdominal Wall:         Previously seen
Nuchal Fold:           Not applicable (>20    Cord Vessels:           Previously seen
wks GA)
Face:                  Absent nasal bone      Kidneys:                Left sided
pyelectasis,  9 mm
Lips:                  Appears normal         Bladder:                Appears normal
Thoracic:              Appears normal         Spine:                  Appears normal
Heart:                 Appears normal         Upper Extremities:      Previously seen
(4CH, axis, and
situs)
RVOT:                  Appears normal         Lower Extremities:      Previously seen
LVOT:                  Appears normal

Other:  Fetus appears to be a male. Heels and 5th digit previously visualized.
Open hands previously visualized.
Cervix Uterus Adnexa

Cervix
Length:            4.2  cm.
Normal appearance by transabdominal scan.
Impression

Bilateral pyelectases were seen on her previous scan. Patient
had cell-free fetal DNA screening that showed low risk for
fetal aneuploidies.
Fetal growth is appropriate for gestational age. Amniotic fluid
is normal and good fetal activity is seen. Right pyelectasis
has resolved. Left renal pelvis measures 9 millimeters, which
is essentially unchanged. Both kidneys look normal with no
increased echogenicities.
I reassured the patient of the findings.
Bilateral pyelectases were seen on her previous scan. Patient
had cell-free fetal DNA screening that showed low risk for
fetal aneuploidies.
Fetal growth is appropriate for gestational age. Amniotic fluid
is normal and good fetal activity is seen. Right pyelectasis
has resolved. Left renal pelvis measures 9 millimeters, which
is essentially unchanged. Both kidneys look normal with no
increased echogenicities. Placenta is NOT low lying.
I reassured the patient of the findings.
Recommendations

An appointment was made for her to return in 8 weeks for
fetal growth and renal assessments.

## 2020-11-13 ENCOUNTER — Ambulatory Visit: Payer: Medicaid Other | Admitting: Physician Assistant

## 2020-11-13 ENCOUNTER — Other Ambulatory Visit: Payer: Self-pay

## 2020-11-13 VITALS — BP 123/80 | HR 74 | Temp 98.7°F | Resp 18 | Ht 66.0 in | Wt 156.0 lb

## 2020-11-13 DIAGNOSIS — H6122 Impacted cerumen, left ear: Secondary | ICD-10-CM

## 2020-11-13 NOTE — Progress Notes (Signed)
Patient has had coffee with cream and sugar. Patient has not taken medication today. Patient reports pain in the left ear for the past 3 day with hearing muffled for 1 week. Patient has not cleaned her ears with any foreign items. Patient used peroxide with no relief. Patient reports pain with yawning or moving the jaw. Patient denies sore throat/ runny nose/ cough.

## 2020-11-13 NOTE — Patient Instructions (Signed)
Please feel free to return to the mobile unit if your symptoms do not improve.  I hope that you feel better soon.  Kennieth Rad, PA-C Physician Assistant [facility] Medicine [email or web address]   Ear Irrigation Ear irrigation is a procedure to wash dirt and wax out of your ear canal. This procedure is also called lavage. You may need ear irrigation if you are having trouble hearing because of a buildup of earwax. You may also have ear irrigation as part of the treatment for an ear infection. Getting wax and dirtout of your ear canal can help ear drops work better. Tell a health care provider about: Any allergies you have. All medicines you are taking, including vitamins, herbs, eye drops, creams, and over-the-counter medicines. Any problems you or family members have had with anesthetic medicines. Any blood disorders you have. Any surgeries you have had. This includes any ear surgeries. Any medical conditions you have. Whether you are pregnant or may be pregnant. What are the risks? Generally, this is a safe procedure. However, problems may occur, including: Infection. Pain. Hearing loss. Fluid and debris being pushed through the eardrum and into the middle ear. This can occur if there are holes in the eardrum. Ear irrigation failing to work. What happens before the procedure? You will talk with your provider about the procedure and plan. You may be given ear drops to put in your ear 15-20 minutes before irrigation. This helps loosen the wax. What happens during the procedure?  A syringe is filled with water or saline solution, which is made of salt and water. The syringe is gently inserted into the ear canal. The fluid is used to flush out wax and other debris. The procedure may vary among health care providers and hospitals. What can I expect after the procedure? After an ear irrigation, follow instructions given to you by your  health careprovider. Follow these instructions at home: Using ear irrigation kits Ear irrigation kits are available for use at home. Ask your health care provider if this is an option for you. In general, you should: Use a home irrigation kit only as told by your health care provider. Read the package instructions carefully. Follow the directions for using the syringe. Use water that is room temperature. Do not do ear irrigation at home if you: Have diabetes. Diabetes increases the risk of infection. Have a hole or tear in your eardrum. Have tubes in your ears. Have had any ear surgery in the past. Have been told not to irrigate your ears. Cleaning your ears  Clean the outside of your ear with a soft washcloth daily. If told by your health care provider, use a few drops of baby oil, mineral oil, glycerin, hydrogen peroxide, or over-the-counter earwax softening drops. Do not use cotton swabs to clean your ears. These can push wax down into the ear canal. Do not put anything into your ears to try to remove wax. This includes ear candles.  General instructions Take over-the-counter and prescription medicines only as told by your health care provider. If you were prescribed an antibiotic medicine, use it as told by your health care provider. Do not stop using the antibiotic even if your condition improves. Keep the ear clean and dry by following the instructions from your health care provider. Keep all follow-up visits. This is important. Visit your health care provider at least once a year to have your ears and hearing checked. Contact a health care provider if: Your hearing is  not improving or is getting worse. You have pain or redness in your ear. You are dizzy. You have ringing in your ears. You have nausea or vomiting. You have fluid, blood, or pus coming out of your ear. Summary Ear irrigation is a procedure to wash dirt and wax out of your ear canal. This procedure is also  called lavage. To perform ear irrigation, ear drops may be put in your ear 15-20 minutes before irrigation. Water or saline solution will be used to flush out earwax and other debris. You may be able to irrigate your ears at home. Ask your health care provider if this is an option for you. Follow your health care provider's instructions. Clean your ears with a soft cloth after irrigation. Do not use cotton swabs to clean your ears. These can push wax down into the ear canal. This information is not intended to replace advice given to you by your health care provider. Make sure you discuss any questions you have with your healthcare provider. Document Revised: 07/04/2019 Document Reviewed: 07/04/2019 Elsevier Patient Education  2022 [place], Adult The ears produce a substance called earwax that helps keep bacteria out of the ear and protects the skin in the ear canal. Occasionally, earwax can build upin the ear and cause discomfort or hearing loss. What are the causes? This condition is caused by a buildup of earwax. Ear canals are self-cleaning. Ear wax is made in the outer part of the ear canal and generally falls out insmall amounts over time. When the self-cleaning mechanism is not working, earwax builds up and can cause decreased hearing and discomfort. Attempting to clean ears with cotton swabs can push the earwax deep into the ear canal and cause decreased hearing andpain. What increases the risk? This condition is more likely to develop in people who: Clean their ears often with cotton swabs. Pick at their ears. Use earplugs or in-ear headphones often, or wear hearing aids. The following factors may also make you more likely to develop this condition: Being female. Being of older age. Naturally producing more earwax. Having narrow ear canals. Having earwax that is overly thick or sticky. Having excess hair in the ear canal. Having eczema. Being dehydrated. What  are the signs or symptoms? Symptoms of this condition include: Reduced or muffled hearing. A feeling of fullness in the ear or feeling that the ear is plugged. Fluid coming from the ear. Ear pain or an itchy ear. Ringing in the ear. Coughing. Balance problems. An obvious piece of earwax that can be seen inside the ear canal. How is this diagnosed? This condition may be diagnosed based on: Your symptoms. Your medical history. An ear exam. During the exam, your health care provider will look into your ear with an instrument called an otoscope. You may have tests, including a hearing test. How is this treated? This condition may be treated by: Using ear drops to soften the earwax. Having the earwax removed by a health care provider. The health care provider may: Flush the ear with water. Use an instrument that has a loop on the end (curette). Use a suction device. Having surgery to remove the wax buildup. This may be done in severe cases. Follow these instructions at home:  Take over-the-counter and prescription medicines only as told by your health care provider. Do not put any objects, including cotton swabs, into your ear. You can clean the opening of your ear canal with a washcloth or facial tissue.  Follow instructions from your health care provider about cleaning your ears. Do not overclean your ears. Drink enough fluid to keep your urine pale yellow. This will help to thin the earwax. Keep all follow-up visits as told. If earwax builds up in your ears often or if you use hearing aids, consider seeing your health care provider for routine, preventive ear cleanings. Ask your health care provider how often you should schedule your cleanings. If you have hearing aids, clean them according to instructions from the manufacturer and your health care provider. Contact a health care provider if: You have ear pain. You develop a fever. You have pus or other fluid coming from your  ear. You have hearing loss. You have ringing in your ears that does not go away. You feel like the room is spinning (vertigo). Your symptoms do not improve with treatment. Get help right away if: You have bleeding from the affected ear. You have severe ear pain. Summary Earwax can build up in the ear and cause discomfort or hearing loss. The most common symptoms of this condition include reduced or muffled hearing, a feeling of fullness in the ear, or feeling that the ear is plugged. This condition may be diagnosed based on your symptoms, your medical history, and an ear exam. This condition may be treated by using ear drops to soften the earwax or by having the earwax removed by a health care provider. Do not put any objects, including cotton swabs, into your ear. You can clean the opening of your ear canal with a washcloth or facial tissue. This information is not intended to replace advice given to you by your health care provider. Make sure you discuss any questions you have with your healthcare provider. Document Revised: 07/04/2019 Document Reviewed: 07/04/2019 Elsevier Patient Education  [place].

## 2020-11-13 NOTE — Progress Notes (Signed)
New Patient Office Visit  Subjective:  Patient ID: Lorraine Stevens, female    DOB: 12-22-1990  Age: 30 y.o. MRN: [id number]  CC:  Chief Complaint  Patient presents with   Ear Pain    Left    HPI Lorraine Stevens states that she started having a clogging sensation, muffled hearing and pain in her left ear approximately 1 week ago.  Reports that she used peroxide and Tylenol without any relief.  Denies any other URI symptoms, no recent swimming or lake exposures, denies any discharge from ear.  No sick contacts.    Past Medical History:  Diagnosis Date   Anemia of pregnancy in third trimester 11/27/2017   Medical history non-contributory     Past Surgical History:  Procedure Laterality Date   NO PAST SURGERIES     WISDOM TOOTH EXTRACTION      Family History  Problem Relation Age of Onset   Hypertension Father    Stroke Maternal Grandmother     Social History   Socioeconomic History   Marital status: Single    Spouse name: Not on file   Number of children: Not on file   Years of education: Not on file   Highest education level: Not on file  Occupational History   Not on file  Tobacco Use   Smoking status: Never   Smokeless tobacco: Never  Vaping Use   Vaping Use: Never used  Substance and Sexual Activity   Alcohol use: No   Drug use: No   Sexual activity: Yes    Birth control/protection: None, Other-see comments    Comment: Depo  Other Topics Concern   Not on file  Social History Narrative   Not on file   Social Determinants of Health   Financial Resource Strain: Not on file  Food Insecurity: Not on file  Transportation Needs: Not on file  Physical Activity: Not on file  Stress: Not on file  Social Connections: Not on file  Intimate Partner Violence: Not on file    ROS Review of Systems  Constitutional:  Negative for chills and fever.  HENT:  Positive for ear pain and hearing loss. Negative for congestion, ear discharge, facial swelling, sinus  pressure and sore throat.   Eyes: Negative.   Respiratory:  Negative for cough.   Cardiovascular:  Negative for chest pain.  Gastrointestinal:  Negative for nausea and vomiting.  Endocrine: Negative.   Genitourinary: Negative.   Musculoskeletal: Negative.   Skin: Negative.   Allergic/Immunologic: Negative.   Neurological:  Negative for headaches.  Hematological: Negative.   Psychiatric/Behavioral: Negative.     Objective:   Today's Vitals: BP 123/80 (BP Location: Left Arm, Patient Position: Sitting, Cuff Size: Normal)   Pulse 74   Temp 98.7 F (37.1 C) (Oral)   Resp 18   Ht 5\' 6"  (1.676 m)   Wt 156 lb (70.8 kg)   SpO2 100%   BMI 25.18 kg/m   Physical Exam Vitals and nursing note reviewed.  Constitutional:      Appearance: Normal appearance.  HENT:     Right Ear: Tympanic membrane, ear canal and external ear normal. There is impacted cerumen.     Left Ear: External ear normal. There is impacted cerumen.     Ears:     Comments: Unable to visualize left TM after irrigation    Nose: Nose normal.     Mouth/Throat:     Mouth: Mucous membranes are moist.     Pharynx: Oropharynx is  clear.  Eyes:     Extraocular Movements: Extraocular movements intact.     Conjunctiva/sclera: Conjunctivae normal.     Pupils: Pupils are equal, round, and reactive to light.  Cardiovascular:     Rate and Rhythm: Regular rhythm.     Pulses: Normal pulses.     Heart sounds: Normal heart sounds.  Pulmonary:     Effort: Pulmonary effort is normal.     Breath sounds: Normal breath sounds.  Musculoskeletal:        General: Normal range of motion.     Cervical back: Normal range of motion and neck supple.  Skin:    General: Skin is warm and dry.  Neurological:     General: No focal deficit present.     Mental Status: She is alert and oriented to person, place, and time.  Psychiatric:        Mood and Affect: Mood normal.        Thought Content: Thought content normal.        Judgment:  Judgment normal.    Assessment & Plan:   Problem List Items Addressed This Visit   None Visit Diagnoses     Hearing loss due to cerumen impaction, left    -  Primary       Outpatient Encounter Medications as of 11/13/2020  Medication Sig   medroxyPROGESTERone (DEPO-PROVERA) 150 MG/ML injection Inject 150 mg into the muscle every 3 (three) months.   [DISCONTINUED] ibuprofen (ADVIL,MOTRIN) 600 MG tablet Take 1 tablet (600 mg total) by mouth every 6 (six) hours as needed.   [DISCONTINUED] docusate sodium (COLACE) 100 MG capsule Take 1 capsule (100 mg total) by mouth 2 (two) times daily.   [DISCONTINUED] fluticasone (FLONASE) 50 MCG/ACT nasal spray Place 2 sprays into both nostrils daily.   [DISCONTINUED] methocarbamol (ROBAXIN) 500 MG tablet Take 1 tablet (500 mg total) by mouth 2 (two) times daily.   [DISCONTINUED] phenol (CHLORASEPTIC) 1.4 % LIQD Use as directed 1 spray in the mouth or throat as needed for throat irritation / pain.   [DISCONTINUED] sulfamethoxazole-trimethoprim (BACTRIM DS) 800-160 MG tablet Take 1 tablet by mouth 2 (two) times daily.   No facility-administered encounter medications on file as of 11/13/2020.  1. Hearing loss due to cerumen impaction, left Irrigation completed in office on left side.  Unable to visualize tympanic membrane after irrigation, some impaction remained.  Patient given Debrox and instructions for home care.  Red flags given for prompt reevaluation.   I have reviewed the patient's medical history (PMH, PSH, Social History, Family History, Medications, and allergies) , and have been updated if relevant. I spent 31 minutes reviewing chart and  face to face time with patient.     Follow-up: Return if symptoms worsen or fail to improve.   Kasandra Knudsen Mayers, PA-C

## 2021-02-03 ENCOUNTER — Other Ambulatory Visit: Payer: Self-pay

## 2021-02-03 ENCOUNTER — Emergency Department: Payer: Medicaid Other

## 2021-02-03 ENCOUNTER — Emergency Department
Admission: EM | Admit: 2021-02-03 | Discharge: 2021-02-03 | Disposition: A | Discharge status: Home / Self Care | Payer: Medicaid Other | Attending: Emergency Medicine | Admitting: Emergency Medicine

## 2021-02-03 DIAGNOSIS — R079 Chest pain, unspecified: Secondary | ICD-10-CM | POA: Diagnosis not present

## 2021-02-03 DIAGNOSIS — Z20822 Contact with and (suspected) exposure to covid-19: Secondary | ICD-10-CM | POA: Diagnosis not present

## 2021-02-03 DIAGNOSIS — M7918 Myalgia, other site: Secondary | ICD-10-CM | POA: Diagnosis present

## 2021-02-03 DIAGNOSIS — R231 Pallor: Secondary | ICD-10-CM | POA: Diagnosis not present

## 2021-02-03 DIAGNOSIS — R0789 Other chest pain: Secondary | ICD-10-CM | POA: Diagnosis not present

## 2021-02-03 DIAGNOSIS — J101 Influenza due to other identified influenza virus with other respiratory manifestations: Secondary | ICD-10-CM | POA: Diagnosis not present

## 2021-02-03 DIAGNOSIS — I10 Essential (primary) hypertension: Secondary | ICD-10-CM | POA: Diagnosis not present

## 2021-02-03 DIAGNOSIS — G4489 Other headache syndrome: Secondary | ICD-10-CM | POA: Diagnosis not present

## 2021-02-03 LAB — CBC
HCT: 37.6 % (ref 36.0–46.0)
Hemoglobin: 12.9 g/dL (ref 12.0–15.0)
MCH: 32.4 pg (ref 26.0–34.0)
MCHC: 34.3 g/dL (ref 30.0–36.0)
MCV: 94.5 fL (ref 80.0–100.0)
Platelets: 127 10*3/uL — ABNORMAL LOW (ref 150–400)
RBC: 3.98 MIL/uL (ref 3.87–5.11)
RDW: 12.7 % (ref 11.5–15.5)
WBC: 5.2 10*3/uL (ref 4.0–10.5)
nRBC: 0 % (ref 0.0–0.2)

## 2021-02-03 LAB — BASIC METABOLIC PANEL
Anion gap: 8 (ref 5–15)
BUN: 10 mg/dL (ref 6–20)
CO2: 21 mmol/L — ABNORMAL LOW (ref 22–32)
Calcium: 9 mg/dL (ref 8.9–10.3)
Chloride: 106 mmol/L (ref 98–111)
Creatinine, Ser: 0.88 mg/dL (ref 0.44–1.00)
GFR, Estimated: >60 mL/min (ref >60)
Glucose, Bld: 87 mg/dL (ref 70–99)
Potassium: 3.8 mmol/L (ref 3.5–5.1)
Sodium: 135 mmol/L (ref 135–145)

## 2021-02-03 LAB — RESP PANEL BY RT-PCR (FLU A&B, COVID) ARPGX2
Influenza A by PCR: POSITIVE — AB
Influenza B by PCR: NEGATIVE
SARS Coronavirus 2 by RT PCR: NEGATIVE

## 2021-02-03 LAB — TROPONIN I (HIGH SENSITIVITY): Troponin I (High Sensitivity): 3 ng/L (ref <18)

## 2021-02-03 MED ORDER — IBUPROFEN 400 MG PO TABS
400.0000 mg | ORAL_TABLET | Freq: Once | ORAL | Status: AC
  Administered 2021-02-03: 400 mg via ORAL
  Filled 2021-02-03: qty 1

## 2021-02-03 NOTE — ED Provider Notes (Signed)
[facility] Emergency Department Provider Note  ____________________________________________   Event Date/Time   First MD Initiated Contact with Patient 02/03/21 1026     (approximate)  I have reviewed the triage vital signs and the nursing notes.   HISTORY  Chief Complaint Influenza   HPI Lorraine Stevens is a 30 y.o. female with past medical history of anemia who presents for assessment of 2 to 3 days of body aches, headaches, subjective fevers, chills, cough and some shortness of breath associated with some chest discomfort especially with deep breathing that started today.  Patient has not had any specific earache, sore throat, hemoptysis, abdominal pain, vomiting, diarrhea, burning with urination, rash or focal extremity weakness numbness tingling or pain.  She denies tobacco abuse or recent surgeries.  She denies any asymmetric lower extremity swelling states she is on medroxyprogesterone but does not take any estrogen contraceptives or other supplements.  She has never had a DVT or PE.  She took some a Profen yesterday but has not taken any today and otherwise has been taking over-the-counter cold and flu medicines.  She notes her son has flu influenza.         Past Medical History:  Diagnosis Date   Anemia of pregnancy in third trimester 11/27/2017   Medical history non-contributory     There are no problems to display for this patient.   Past Surgical History:  Procedure Laterality Date   NO PAST SURGERIES     WISDOM TOOTH EXTRACTION      Prior to Admission medications   Medication Sig Start Date End Date Taking? Authorizing Provider  medroxyPROGESTERone (DEPO-PROVERA) 150 MG/ML injection Inject 150 mg into the muscle every 3 (three) months.    [provider]    Allergies Patient has no known allergies.  Family History  Problem Relation Age of Onset   Hypertension Father    Stroke Maternal Grandmother     Social  History Social History   Tobacco Use   Smoking status: Never   Smokeless tobacco: Never  Vaping Use   Vaping Use: Never used  Substance Use Topics   Alcohol use: No   Drug use: No    Review of Systems  Review of Systems  Constitutional:  Positive for chills, fever and malaise/fatigue.  HENT:  Positive for congestion. Negative for sore throat.   Eyes:  Negative for pain.  Respiratory:  Positive for cough and shortness of breath. Negative for stridor.   Cardiovascular:  Positive for chest pain.  Gastrointestinal:  Negative for abdominal pain, diarrhea and vomiting.  Musculoskeletal:  Positive for myalgias.  Skin:  Negative for rash.  Neurological:  Positive for headaches. Negative for seizures and loss of consciousness.  Psychiatric/Behavioral:  Negative for suicidal ideas.   All other systems reviewed and are negative.    ____________________________________________   PHYSICAL EXAM:  VITAL SIGNS: ED Triage Vitals [02/03/21 0835]  Enc Vitals Group     BP 127/84     Pulse Rate 75     Resp 20     Temp 99.3 F (37.4 C)     Temp Source Oral     SpO2 98 %     Weight 150 lb (68 kg)     Height 5\' 6"  (1.676 m)     Head Circumference      Peak Flow      Pain Score 10     Pain Loc      Pain Edu?  Excl. in GC?    Vitals:   02/03/21 0835  BP: 127/84  Pulse: 75  Resp: 20  Temp: 99.3 F (37.4 C)  SpO2: 98%   Physical Exam Vitals and nursing note reviewed.  Constitutional:      General: She is not in acute distress.    Appearance: She is well-developed.  HENT:     Head: Normocephalic and atraumatic.     Right Ear: External ear normal.     Left Ear: External ear normal.     Nose: Nose normal.  Eyes:     Conjunctiva/sclera: Conjunctivae normal.  Cardiovascular:     Rate and Rhythm: Normal rate and regular rhythm.     Heart sounds: No murmur heard. Pulmonary:     Effort: Pulmonary effort is normal. No respiratory distress.     Breath sounds: Normal  breath sounds.  Abdominal:     Palpations: Abdomen is soft.     Tenderness: There is no abdominal tenderness.  Musculoskeletal:     Cervical back: Neck supple.     Right lower leg: No edema.     Left lower leg: No edema.  Skin:    General: Skin is warm and dry.     Capillary Refill: Capillary refill takes less than 2 seconds.  Neurological:     Mental Status: She is alert and oriented to person, place, and time.    Anterior chest wall is tender along bilateral costochondral joints. ____________________________________________   LABS (all labs ordered are listed, but only abnormal results are displayed)  Labs Reviewed  RESP PANEL BY RT-PCR (FLU A&B, COVID) ARPGX2 - Abnormal; Notable for the following components:      Result Value   Influenza A by PCR POSITIVE (*)    All other components within normal limits  CBC - Abnormal; Notable for the following components:   Platelets 127 (*)    All other components within normal limits  BASIC METABOLIC PANEL - Abnormal; Notable for the following components:   CO2 21 (*)    All other components within normal limits  TROPONIN I (HIGH SENSITIVITY)   ____________________________________________  EKG  ECG remarkable for sinus rhythm with a ventricular rate of 75, normal axis, nonspecific ST change in lead II, V5 and V6 without other clear evidence of acute ischemia or significant arrhythmia. ____________________________________________  RADIOLOGY  ED MD interpretation: Chest x-ray shows no evidence of focal consolidation, large effusion, pneumothorax, overt edema or other acute thoracic process.  Official radiology report(s): DG Chest 2 View  Result Date: 02/03/2021 CLINICAL DATA:  30 year old female with shortness of breath, body ache, fever. Daughter recently positive for influenza. EXAM: CHEST - 2 VIEW COMPARISON:  Chest radiographs 02/11/2018 and earlier. FINDINGS: Seated AP and lateral views of the chest. Stable somewhat low lung  volumes over this series of exams. Normal cardiac size and mediastinal contours. Visualized tracheal air column is within normal limits. Lung markings appear stable and within normal limits. Both lungs appear clear. No pneumothorax or pleural effusion. Mild thoracolumbar scoliosis. No acute osseous abnormality identified. Negative visible bowel gas. IMPRESSION: No cardiopulmonary abnormality.  Mild scoliosis. Electronically Signed   By: Odessa Fleming M.D.   On: 02/03/2021 09:20    ____________________________________________   PROCEDURES  Procedure(s) performed (including Critical Care):  Procedures   ____________________________________________   INITIAL IMPRESSION / ASSESSMENT AND PLAN / ED COURSE      Patient presents with above-stated history exam for assessment of some chest discomfort associate with cough, shortness  of breath, fevers, body aches, chills, myalgias and general fatigue.  This in the setting of family member doing positive for influenza.  On arrival patient is afebrile hemodynamically stable.  She is not hypoxic, tachycardic, hypertensive or tachypneic on my exam.  Her lungs are clear bilaterally and her abdomen is soft.  She does not appear significantly dehydrated meningitic or septic.  I suspect likely acute viral infection with influenza high on the differential.  PCR is positive for this and negative for flu.  Chest x-ray shows no evidence of focal consolidation, large effusion, pneumothorax, overt edema or other acute thoracic process.  ECG remarkable for sinus rhythm with a ventricular rate of 75, normal axis, nonspecific ST change in lead II, V5 and V6 without other clear evidence of acute ischemia or significant arrhythmia.  Nonelevated troponin, and ECG not suggestive of myocarditis or ACS.  BMP without any significant electrolyte or metabolic derangements.  CBC without leukocytosis or acute anemia.  Advised patient of suspected source of her symptoms.  Discussed  using ibuprofen in addition to Tylenol for chest discomfort and suspect likely a component of pleurisy and costochondritis with a lower suspicion for PE at this time.  Advise she return immediately for any acute worsening of symptoms.  She is amenable with plan.  Discharged in stable condition.  Strict return precautions advised and discussed.     ____________________________________________   FINAL CLINICAL IMPRESSION(S) / ED DIAGNOSES  Final diagnoses:  Influenza A    Medications  ibuprofen (ADVIL) tablet 400 mg (400 mg Oral Given 02/03/21 1051)     ED Discharge Orders     None        Note:  This document was prepared using Dragon voice recognition software and may include unintentional dictation errors.    Gilles Chiquito, MD 02/03/21 1140

## 2021-02-03 NOTE — ED Triage Notes (Signed)
See previous triage note, pt reports shob and body aches for the past two days. NAD noted

## 2021-02-03 NOTE — ED Notes (Signed)
See triage note  presents with low grade fever and body aches  states she developed sx's 2 days ago  having some discomfort in chest with inspiration

## 2021-02-03 NOTE — ED Triage Notes (Signed)
Pt to ED via EMS with c/o of bodyaches, fever, headaches and Chest pain with movement. Her 30 year old was daignosised with the flu. She has had these symptoms for two days. She did take tylenol cold and flu (2) at 0845.

## 2021-04-15 ENCOUNTER — Ambulatory Visit: Payer: Medicaid Other | Admitting: Physician Assistant

## 2021-04-15 ENCOUNTER — Other Ambulatory Visit: Payer: Self-pay

## 2021-04-15 VITALS — BP 140/87 | HR 84 | Temp 98.2°F | Resp 18 | Ht 66.0 in | Wt 149.0 lb

## 2021-04-15 DIAGNOSIS — N7689 Other specified inflammation of vagina and vulva: Secondary | ICD-10-CM | POA: Diagnosis not present

## 2021-04-15 MED ORDER — DOXYCYCLINE HYCLATE 100 MG PO CAPS: 100.0000 mg | ORAL_CAPSULE | Freq: Two times a day (BID) | ORAL | 0 refills | Status: AC

## 2021-04-15 NOTE — Progress Notes (Signed)
Patient complains of a cyst in the inner thigh that presented 2 weeks ago. Patient resolved concern with warm compress and apple cider vinegar. Patient recurring cyst presented 2 days ago with yellow puss with odor and redness in the area.

## 2021-04-15 NOTE — Progress Notes (Signed)
Established Patient Office Visit  Subjective:  Patient ID: Lorraine Stevens, female    DOB: [birth year]  Age: 31 y.o. MRN: [id number]  CC:  Chief Complaint  Patient presents with   Cyst    HPI Lorraine Stevens States that she has had a purulent tender nodule on her right labia for the past 2 weeks.  States that it started out as a hard knot, states that she did use warm compresses and it did start having yellow foul odor discharge, states that it started to resolve but then restarted a few days ago.  Has also tried using vinegar without much relief.  Has been using over-the-counter pain medication to help control discomfort.   Past Medical History:  Diagnosis Date   Anemia of pregnancy in third trimester 11/27/2017   Medical history non-contributory     Past Surgical History:  Procedure Laterality Date   NO PAST SURGERIES     WISDOM TOOTH EXTRACTION      Family History  Problem Relation Age of Onset   Hypertension Father    Stroke Maternal Grandmother     Social History   Socioeconomic History   Marital status: Single    Spouse name: Not on file   Number of children: Not on file   Years of education: Not on file   Highest education level: Not on file  Occupational History   Not on file  Tobacco Use   Smoking status: Never   Smokeless tobacco: Never  Vaping Use   Vaping Use: Never used  Substance and Sexual Activity   Alcohol use: No   Drug use: No   Sexual activity: Yes    Birth control/protection: None, Other-see comments    Comment: Depo  Other Topics Concern   Not on file  Social History Narrative   Not on file   Social Determinants of Health   Financial Resource Strain: Not on file  Food Insecurity: Not on file  Transportation Needs: Not on file  Physical Activity: Not on file  Stress: Not on file  Social Connections: Not on file  Intimate Partner Violence: Not on file    Outpatient Medications Prior to Visit  Medication Sig Dispense Refill    medroxyPROGESTERone (DEPO-PROVERA) 150 MG/ML injection Inject 150 mg into the muscle every 3 (three) months.     No facility-administered medications prior to visit.    No Known Allergies  ROS Review of Systems  Constitutional:  Negative for chills and fever.  HENT: Negative.    Eyes: Negative.   Respiratory:  Negative for shortness of breath.   Cardiovascular:  Negative for chest pain.  Gastrointestinal: Negative.   Endocrine: Negative.   Genitourinary: Negative.   Musculoskeletal: Negative.   Skin:  Positive for rash.  Allergic/Immunologic: Negative.   Neurological: Negative.   Hematological: Negative.   Psychiatric/Behavioral: Negative.       Objective:    Physical Exam Vitals reviewed.  Constitutional:      Appearance: Normal appearance.  HENT:     Head: Normocephalic and atraumatic.     Right Ear: External ear normal.     Left Ear: External ear normal.     Nose: Nose normal.     Mouth/Throat:     Mouth: Mucous membranes are moist.     Pharynx: Oropharynx is clear.  Eyes:     Extraocular Movements: Extraocular movements intact.     Conjunctiva/sclera: Conjunctivae normal.     Pupils: Pupils are equal, round, and reactive to light.  Cardiovascular:     Rate and Rhythm: Normal rate and regular rhythm.     Pulses: Normal pulses.     Heart sounds: Normal heart sounds.  Pulmonary:     Effort: Pulmonary effort is normal.     Breath sounds: Normal breath sounds.  Genitourinary:    Comments: Nodule on right outer labia, non-flucuant Musculoskeletal:        General: Normal range of motion.     Cervical back: Normal range of motion and neck supple.  Skin:    General: Skin is warm and dry.  Neurological:     General: No focal deficit present.     Mental Status: She is alert and oriented to person, place, and time.  Psychiatric:        Mood and Affect: Mood normal.        Behavior: Behavior normal.        Thought Content: Thought content normal.         Judgment: Judgment normal.    BP 140/87 (BP Location: Left Arm, Patient Position: Sitting, Cuff Size: Normal)    Pulse 84    Temp 98.2 F (36.8 C) (Oral)    Resp 18    Ht 5\' 6"  (1.676 m)    Wt 149 lb (67.6 kg)    LMP 02/11/2021 Comment: spotting prior to depo injection   SpO2 100%    BMI 24.05 kg/m  Wt Readings from Last 3 Encounters:  04/15/21 149 lb (67.6 kg)  02/03/21 150 lb (68 kg)  11/13/20 156 lb (70.8 kg)     Health Maintenance Due  Topic Date Due   COVID-19 Vaccine (1) Never done   Hepatitis C Screening  Never done   PAP-Cervical Cytology Screening  Never done   PAP SMEAR-Modifier  Never done   INFLUENZA VACCINE  10/28/2020    There are no preventive care reminders to display for this patient.  No results found for: TSH Lab Results  Component Value Date   WBC 5.2 02/03/2021   HGB 12.9 02/03/2021   HCT 37.6 02/03/2021   MCV 94.5 02/03/2021   PLT 127 (L) 02/03/2021   Lab Results  Component Value Date   NA 135 02/03/2021   K 3.8 02/03/2021   CO2 21 (L) 02/03/2021   GLUCOSE 87 02/03/2021   BUN 10 02/03/2021   CREATININE 0.88 02/03/2021   BILITOT 0.6 07/14/2017   ALKPHOS 39 07/14/2017   AST 21 07/14/2017   ALT 9 (L) 07/14/2017   PROT 7.4 07/14/2017   ALBUMIN 4.0 07/14/2017   CALCIUM 9.0 02/03/2021   ANIONGAP 8 02/03/2021   No results found for: CHOL No results found for: HDL No results found for: LDLCALC No results found for: TRIG No results found for: CHOLHDL No results found for: 13/09/2020    Assessment & Plan:   Problem List Items Addressed This Visit   None Visit Diagnoses     Boil of groin    -  Primary   Relevant Medications   doxycycline (VIBRAMYCIN) 100 MG capsule       Meds ordered this encounter  Medications   doxycycline (VIBRAMYCIN) 100 MG capsule    Sig: Take 1 capsule (100 mg total) by mouth 2 (two) times daily for 7 days.    Dispense:  20 capsule    Refill:  0    Order Specific Question:   Supervising Provider    Answer:    WRIGHT, PATRICK E [[id number]]   1. Boil vagina Trial  doxycycline.  Patient education given on supportive care Red flags for prompt reevaluation. - doxycycline (VIBRAMYCIN) 100 MG capsule; Take 1 capsule (100 mg total) by mouth 2 (two) times daily for 7 days.  Dispense: 20 capsule; Refill: 0   I have reviewed the patient's medical history (PMH, PSH, Social History, Family History, Medications, and allergies) , and have been updated if relevant. I spent 20 minutes reviewing chart and  face to face time with patient.    Follow-up: Return if symptoms worsen or fail to improve.    Kasandra Knudsen Mayers, PA-C

## 2021-04-15 NOTE — Patient Instructions (Signed)
You are going to take doxycycline twice a day for the next 7 days.  Make sure to keep the area clean and dry.  Hibiclens is a good OTC soap for this.  Please let us know if there is anything else we can do for you.  Roney Jaffe, PA-C Physician Assistant [facility] [email or web address]   Skin Abscess A skin abscess is an infected area on or under your skin that contains a collection of pus and other material. An abscess may also be called a furuncle, carbuncle, or boil. An abscess can occur in or on almost any part of your body. Some abscesses break open (rupture) on their own. Most continue to get worse unless they are treated. The infection can spread deeper into the body and eventually into your blood, which can make you feel ill. Treatment usually involves draining the abscess. What are the causes? An abscess occurs when germs, like bacteria, pass through your skin and cause an infection. This may be caused by: A scrape or cut on your skin. A puncture wound through your skin, including a needle injection or insect bite. Blocked oil or sweat glands. Blocked and infected hair follicles. A cyst that forms beneath your skin (sebaceous cyst) and becomes infected. What increases the risk? This condition is more likely to develop in people who: Have a weak body defense system (immune system). Have diabetes. Have dry and irritated skin. Get frequent injections or use illegal IV drugs. Have a foreign body in a wound, such as a splinter. Have problems with their lymph system or veins. What are the signs or symptoms? Symptoms of this condition include: A painful, firm bump under the skin. A bump with pus at the top. This may break through the skin and drain. Other symptoms include: Redness surrounding the abscess site. Warmth. Swelling of the lymph nodes (glands) near the abscess. Tenderness. A sore on the skin. How is this  diagnosed? This condition may be diagnosed based on: A physical exam. Your medical history. A sample of pus. This may be used to find out what is causing the infection. Blood tests. Imaging tests, such as an ultrasound, CT scan, or MRI. How is this treated? A small abscess that drains on its own may not need treatment. Treatment for larger abscesses may include: Moist heat or heat pack applied to the area several times a day. A procedure to drain the abscess (incision and drainage). Antibiotic medicines. For a severe abscess, you may first get antibiotics through an IV and then change to antibiotics by mouth. Follow these instructions at home: Medicines  Take over-the-counter and prescription medicines only as told by your health care provider. If you were prescribed an antibiotic medicine, take it as told by your health care provider. Do not stop taking the antibiotic even if you start to feel better. Abscess care  If you have an abscess that has not drained, apply heat to the affected area. Use the heat source that your health care provider recommends, such as a moist heat pack or a heating pad. Place a towel between your skin and the heat source. Leave the heat on for 20-30 minutes. Remove the heat if your skin turns bright red. This is especially important if you are unable to feel pain, heat, or cold. You may have a greater risk of getting burned. Follow instructions from your health care provider about how to take care of your abscess. Make sure you: Cover the abscess with a  bandage (dressing). Change your dressing or gauze as told by your health care provider. Wash your hands with soap and water before you change the dressing or gauze. If soap and water are not available, use hand sanitizer. Check your abscess every day for signs of a worsening infection. Check for: More redness, swelling, or pain. More fluid or blood. Warmth. More pus or a bad smell. General instructions To  avoid spreading the infection: Do not share personal care items, towels, or hot tubs with others. Avoid making skin contact with other people. Keep all follow-up visits as told by your health care provider. This is important. Contact a health care provider if you have: More redness, swelling, or pain around your abscess. More fluid or blood coming from your abscess. Warm skin around your abscess. More pus or a bad smell coming from your abscess. A fever. Muscle aches. Chills or a general ill feeling. Get help right away if you: Have severe pain. See red streaks on your skin spreading away from the abscess. Summary A skin abscess is an infected area on or under your skin that contains a collection of pus and other material. A small abscess that drains on its own may not need treatment. Treatment for larger abscesses may include having a procedure to drain the abscess and taking an antibiotic. This information is not intended to replace advice given to you by your health care provider. Make sure you discuss any questions you have with your health care provider. Document Revised: 07/07/2018 Document Reviewed: 04/29/2017 Elsevier Patient Education  2022 [employer].

## 2021-10-29 ENCOUNTER — Encounter: Payer: Self-pay | Admitting: Physician Assistant

## 2021-10-29 ENCOUNTER — Ambulatory Visit: Payer: Medicaid Other | Admitting: Physician Assistant

## 2021-10-29 VITALS — BP 162/88 | HR 68 | Resp 19 | Ht 66.0 in | Wt 171.0 lb

## 2021-10-29 DIAGNOSIS — H1031 Unspecified acute conjunctivitis, right eye: Secondary | ICD-10-CM

## 2021-10-29 DIAGNOSIS — R030 Elevated blood-pressure reading, without diagnosis of hypertension: Secondary | ICD-10-CM

## 2021-10-29 MED ORDER — POLYMYXIN B-TRIMETHOPRIM 10000-0.1 UNIT/ML-% OP SOLN: 1.0000 [drp] | OPHTHALMIC | 0 refills | Status: AC

## 2021-10-29 NOTE — Patient Instructions (Signed)
You are going to use Polytrim drops to help with your infection.  Your blood pressure is elevated today, I strongly encourage you to check your blood pressure on a daily basis, keep a written log and have available for all office visits.  If your blood pressure continues to be elevated, please feel free to return to the mobile unit for further evaluation.  I encourage you to monitor your sodium intake, you should aim for around 2000 mg of sodium a day.  Roney Jaffe, PA-C Physician Assistant [facility] [email or web address]   Allergic Conjunctivitis, Adult Allergic conjunctivitis is inflammation of the conjunctiva. The conjunctiva is the thin, clear membrane that covers the white part of the eye and the inner surface of the eyelid. In this condition: The blood vessels in the conjunctiva become irritated and swell. The eyes become red or pink and feel itchy. Allergic conjunctivitis cannot be spread from person to person. This condition can develop at any age and may be outgrown. What are the causes? This condition is caused by allergens. These are things that can cause an allergic reaction in some people but not in other people. Common allergens include: Outdoor allergens, such as: Pollen, including pollen from grass and weeds. Mold spores. Car fumes. Indoor allergens, such as: Dust. Smoke. Mold spores. Proteins in a pet's urine, saliva, or dander. What increases the risk? You may be more likely to develop this condition if you have a family history of these things: Allergies. Conditions caused by being exposed to allergens, such as: Allergic rhinitis. This is an allergic reaction that affects the nose. Bronchial asthma. This condition affects the large airways in the lungs and makes breathing difficult. Atopic dermatitis (eczema). This is inflammation of the skin that is long-term (chronic). What are the signs or  symptoms? Symptoms of this condition include eyes that are: Itchy. Red. Watery. Puffy. Your eyes may also: Sting or burn. Have clear fluid draining from them. Have thick mucus discharge and pain (vernal conjunctivitis). How is this diagnosed? This condition may be diagnosed by: Your medical history. A physical exam. Tests of the fluid draining from your eyes to rule out other causes. Other tests to confirm the diagnosis, including: Testing for allergies. The skin may be pricked with a tiny needle. The pricked area is then exposed to small amounts of allergens. Testing for other eye conditions. Tests may include: Blood tests. Tissue scrapings from your eyelid. The tissue is then checked under a microscope. How is this treated? This condition may be treated with: Cold, wet cloths (cold compresses) to soothe itching and swelling. Washing the face to remove allergens. Eye drops. These may be prescription or over-the-counter. You may need to try different types to see which one works best for you, such as: Eye drops that block the allergic reaction (antihistamine). Eye drops that reduce swelling and irritation (anti-inflammatory). Steroid eye drops, which may be given if other treatments have not worked (vernal conjunctivitis). Oral antihistamine medicines. These are medicines taken by mouth to lessen your allergic reaction. You may need these if eye drops do not help or are difficult to use. Follow these instructions at home: Eye care Apply a clean, cold compress to your eyes for 10-20 minutes, 3-4 times a day. Do not touch or rub your eyes. Do not wear contact lenses until the inflammation is gone. Wear glasses instead. Do not wear eye makeup until the inflammation is gone. General instructions Avoid known allergens whenever possible. Take or apply over-the-counter  and prescription medicines only as told by your health care provider. These include any eye drops. Drink enough fluid  to keep your urine pale yellow. Keep all follow-up visits as told by your health care provider. This is important. Contact a health care provider if: Your symptoms get worse or do not get better with treatment. You have mild eye pain. You become sensitive to light. You have spots or blisters on your eyes. You have pus draining from your eyes. You have a fever. Get help right away if: You have redness, swelling, or other symptoms in only one eye. Your vision is blurred or you have other vision changes. You have severe eye pain. Summary Allergic conjunctivitis is inflammation of the clear membrane that covers the white part of the eye and the inner surface of the eyelid. Take or apply over-the-counter and prescription medicines only as told by your health care provider. These include eye drops. Do not touch or rub your eyes. Contact a health care provider if your symptoms get worse or do not get better with treatment. This information is not intended to replace advice given to you by your health care provider. Make sure you discuss any questions you have with your health care provider. Document Revised: 02/03/2019 Document Reviewed: 02/06/2019 Elsevier Patient Education  2023 Elsevier Inc.   Low-Sodium Eating Plan Sodium, which is an element that makes up salt, helps you maintain a healthy balance of fluids in your body. Too much sodium can increase your blood pressure and cause fluid and waste to be held in your body. Your health care provider or dietitian may recommend following this plan if you have high blood pressure (hypertension), kidney disease, liver disease, or heart failure. Eating less sodium can help lower your blood pressure, reduce swelling, and protect your heart, liver, and kidneys. What are tips for following this plan? Reading food labels The Nutrition Facts label lists the amount of sodium in one serving of the food. If you eat more than one serving, you must multiply  the listed amount of sodium by the number of servings. Choose foods with less than 140 mg of sodium per serving. Avoid foods with 300 mg of sodium or more per serving. Shopping  Look for lower-sodium products, often labeled as "low-sodium" or "no salt added." Always check the sodium content, even if foods are labeled as "unsalted" or "no salt added." Buy fresh foods. Avoid canned foods and pre-made or frozen meals. Avoid canned, cured, or processed meats. Buy breads that have less than 80 mg of sodium per slice. Cooking  Eat more home-cooked food and less restaurant, buffet, and fast food. Avoid adding salt when cooking. Use salt-free seasonings or herbs instead of table salt or sea salt. Check with your health care provider or pharmacist before using salt substitutes. Cook with plant-based oils, such as canola, sunflower, or olive oil. Meal planning When eating at a restaurant, ask that your food be prepared with less salt or no salt, if possible. Avoid dishes labeled as brined, pickled, cured, smoked, or made with soy sauce, miso, or teriyaki sauce. Avoid foods that contain MSG (monosodium glutamate). MSG is sometimes added to [place] food, bouillon, and some canned foods. Make meals that can be grilled, baked, poached, roasted, or steamed. These are generally made with less sodium. General information Most people on this plan should limit their sodium intake to 1,500-2,000 mg (milligrams) of sodium each day. What foods should I eat? Fruits Fresh, frozen, or canned fruit. Fruit  juice. Vegetables Fresh or frozen vegetables. "No salt added" canned vegetables. "No salt added" tomato sauce and paste. Low-sodium or reduced-sodium tomato and vegetable juice. Grains Low-sodium cereals, including oats, puffed wheat and rice, and shredded wheat. Low-sodium crackers. Unsalted rice. Unsalted pasta. Low-sodium bread. Whole-grain breads and whole-grain pasta. Meats and other proteins Fresh or  frozen (no salt added) meat, poultry, seafood, and fish. Low-sodium canned tuna and salmon. Unsalted nuts. Dried peas, beans, and lentils without added salt. Unsalted canned beans. Eggs. Unsalted nut butters. Dairy Milk. Soy milk. Cheese that is naturally low in sodium, such as ricotta cheese, fresh mozzarella, or Swiss cheese. Low-sodium or reduced-sodium cheese. Cream cheese. Yogurt. Seasonings and condiments Fresh and dried herbs and spices. Salt-free seasonings. Low-sodium mustard and ketchup. Sodium-free salad dressing. Sodium-free light mayonnaise. Fresh or refrigerated horseradish. Lemon juice. Vinegar. Other foods Homemade, reduced-sodium, or low-sodium soups. Unsalted popcorn and pretzels. Low-salt or salt-free chips. The items listed above may not be a complete list of foods and beverages you can eat. Contact a dietitian for more information. What foods should I avoid? Vegetables Sauerkraut, pickled vegetables, and relishes. Olives. [place] fries. Onion rings. Regular canned vegetables (not low-sodium or reduced-sodium). Regular canned tomato sauce and paste (not low-sodium or reduced-sodium). Regular tomato and vegetable juice (not low-sodium or reduced-sodium). Frozen vegetables in sauces. Grains Instant hot cereals. Bread stuffing, pancake, and biscuit mixes. Croutons. Seasoned rice or pasta mixes. Noodle soup cups. Boxed or frozen macaroni and cheese. Regular salted crackers. Self-rising flour. Meats and other proteins Meat or fish that is salted, canned, smoked, spiced, or pickled. Precooked or cured meat, such as sausages or meat loaves. Tomasa Blase. Ham. Pepperoni. Hot dogs. Corned beef. Chipped beef. Salt pork. Jerky. Pickled herring. Anchovies and sardines. Regular canned tuna. Salted nuts. Dairy Processed cheese and cheese spreads. Hard cheeses. Cheese curds. Blue cheese. Feta cheese. String cheese. Regular cottage cheese. Buttermilk. Canned milk. Fats and oils Salted butter. Regular  margarine. Ghee. Bacon fat. Seasonings and condiments Onion salt, garlic salt, seasoned salt, table salt, and sea salt. Canned and packaged gravies. Worcestershire sauce. Tartar sauce. Barbecue sauce. Teriyaki sauce. Soy sauce, including reduced-sodium. Steak sauce. Fish sauce. Oyster sauce. Cocktail sauce. Horseradish that you find on the shelf. Regular ketchup and mustard. Meat flavorings and tenderizers. Bouillon cubes. Hot sauce. Pre-made or packaged marinades. Pre-made or packaged taco seasonings. Relishes. Regular salad dressings. Salsa. Other foods Salted popcorn and pretzels. Corn chips and puffs. Potato and tortilla chips. Canned or dried soups. Pizza. Frozen entrees and pot pies. The items listed above may not be a complete list of foods and beverages you should avoid. Contact a dietitian for more information. Summary Eating less sodium can help lower your blood pressure, reduce swelling, and protect your heart, liver, and kidneys. Most people on this plan should limit their sodium intake to 1,500-2,000 mg (milligrams) of sodium each day. Canned, boxed, and frozen foods are high in sodium. Restaurant foods, fast foods, and pizza are also very high in sodium. You also get sodium by adding salt to food. Try to cook at home, eat more fresh fruits and vegetables, and eat less fast food and canned, processed, or prepared foods. This information is not intended to replace advice given to you by your health care provider. Make sure you discuss any questions you have with your health care provider. Document Revised: 04/21/2019 Document Reviewed: 02/15/2019 Elsevier Patient Education  2023 [employer].

## 2021-10-29 NOTE — Progress Notes (Unsigned)
   Established Patient Office Visit  Subjective   Patient ID: Lorraine Stevens, female    DOB: 1990/05/20  Age: 31 y.o. MRN: [id number]  No chief complaint on file.   States that she has been having a stye on her right eye, feels gritty Swollen, eye lid,  Warm compresses Works at the hospital Wakes up with swollen No pain No sick contacts   Waking up with migraines Feet swollen     {History (Optional):23778}  ROS    Objective:     There were no vitals taken for this visit. {Vitals History (Optional):23777}  Physical Exam   No results found for any visits on 10/29/21.  {Labs (Optional):23779}  The ASCVD Risk score (Arnett DK, et al., 2019) failed to calculate for the following reasons:   The 2019 ASCVD risk score is only valid for ages 20 to 10    Assessment & Plan:   Problem List Items Addressed This Visit   None   No follow-ups on file.    Kasandra Knudsen Mayers, PA-C

## 2021-10-30 DIAGNOSIS — R030 Elevated blood-pressure reading, without diagnosis of hypertension: Secondary | ICD-10-CM | POA: Insufficient documentation

## 2022-01-19 ENCOUNTER — Ambulatory Visit
Admission: EM | Admit: 2022-01-19 | Discharge: 2022-01-19 | Disposition: A | Discharge status: Home / Self Care | Payer: Medicaid Other | Attending: Urgent Care | Admitting: Urgent Care

## 2022-01-19 DIAGNOSIS — I10 Essential (primary) hypertension: Secondary | ICD-10-CM | POA: Diagnosis not present

## 2022-01-19 MED ORDER — OLMESARTAN MEDOXOMIL 5 MG PO TABS: 5.0000 mg | ORAL_TABLET | Freq: Every day | ORAL | 0 refills | Status: AC

## 2022-01-19 NOTE — ED Provider Notes (Signed)
Roderic Palau    CSN: [id number] Arrival date & time: 01/19/22  1632      History   Chief Complaint No chief complaint on file.   HPI Lorraine Stevens is a 31 y.o. female.   HPI  Initial triage by provider.  Presents to UC with report of recently elevated BP without diagnosis of hypertension.  Patient was seen by OB/GYN recently and made agreement to watch and wait.  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  She also endorses swelling in her legs which worsens as the day progresses.  She often works double shifts and wears compression stockings half her time at work.  Endorses swelling worsens immediately after removing stockings.  Past Medical History:  Diagnosis Date   Anemia of pregnancy in third trimester 11/27/2017   Medical history non-contributory     Patient Active Problem List   Diagnosis Date Noted   Elevated blood pressure reading in office without diagnosis of hypertension 10/30/2021    Past Surgical History:  Procedure Laterality Date   NO PAST SURGERIES     WISDOM TOOTH EXTRACTION      OB History     Gravida  2   Para  2   Term  2   Preterm      AB      Living  2      SAB      IAB      Ectopic      Multiple  0   Live Births  2            Home Medications    Prior to Admission medications   Medication Sig Start Date End Date Taking? Authorizing Provider  medroxyPROGESTERone (DEPO-PROVERA) 150 MG/ML injection Inject 150 mg into the muscle every 3 (three) months.    [provider]    Family History Family History  Problem Relation Age of Onset   Hypertension Father    Stroke Maternal Grandmother     Social History Social History   Tobacco Use   Smoking status: Never   Smokeless tobacco: Never  Vaping Use   Vaping Use: Never used  Substance Use Topics   Alcohol use: No   Drug use: No      Allergies   Patient has no known allergies.   Review of Systems Review of Systems   Physical Exam Triage Vital Signs ED Triage Vitals  Enc Vitals Group     BP      Pulse      Resp      Temp      Temp src      SpO2      Weight      Height      Head Circumference      Peak Flow      Pain Score      Pain Loc      Pain Edu?      Excl. in [place]?    No data found.  Updated Vital Signs There were no vitals taken for this visit.  Visual Acuity Right Eye Distance:   Left Eye Distance:   Bilateral Distance:    Right Eye Near:   Left Eye Near:    Bilateral Near:     Physical Exam Vitals reviewed.  Constitutional:      Appearance: Normal appearance.  Skin:    General: Skin  is warm and dry.  Neurological:     General: No focal deficit present.     Mental Status: She is alert and oriented to person, place, and time.  Psychiatric:        Mood and Affect: Mood normal.        Behavior: Behavior normal.      UC Treatments / Results  Labs (all labs ordered are listed, but only abnormal results are displayed) Labs Reviewed - No data to display  EKG   Radiology No results found.  Procedures Procedures (including critical care time)  Medications Ordered in UC Medications - No data to display  Initial Impression / Assessment and Plan / UC Course  I have reviewed the triage vital signs and the nursing notes.  Pertinent labs & imaging results that were available during my care of the patient were reviewed by me and considered in my medical decision making (see chart for details).   New diagnosis of hypertension.  Patient requesting initiation of treatment.  Will prescribe initial medication.  Given her tendency toward lower extremity edema, will avoid amlodipine.  Recommending patient wear compression stockings whenever working or on her feet for significant lengths of time.  Wear for full shift, removing at home.  Prescribing olmesartan.   Final Clinical  Impressions(s) / UC Diagnoses   Final diagnoses:  None   Discharge Instructions   None    ED Prescriptions   None    PDMP not reviewed this encounter.   Charma Igo, [place] 01/19/22 1705

## 2022-01-19 NOTE — Discharge Instructions (Addendum)
Please follow-up with your primary care or OB/GYN provider regarding ongoing management of your hypertension.  Continue to monitor your blood pressure and if continued elevated, contact your OB/GYN for dose adjustment.

## 2022-01-19 NOTE — ED Notes (Addendum)
Pt. Triaged and discharged by Provider Annie Main Immordino

## 2022-03-07 ENCOUNTER — Emergency Department
Admission: EM | Admit: 2022-03-07 | Discharge: 2022-03-07 | Discharge status: Against Medical Advice | Payer: Medicaid Other | Attending: Emergency Medicine | Admitting: Emergency Medicine

## 2022-03-07 ENCOUNTER — Ambulatory Visit
Admission: EM | Admit: 2022-03-07 | Discharge: 2022-03-07 | Disposition: A | Discharge status: Home / Self Care | Payer: Medicaid Other | Attending: Urgent Care | Admitting: Urgent Care

## 2022-03-07 ENCOUNTER — Emergency Department: Payer: Medicaid Other

## 2022-03-07 ENCOUNTER — Encounter: Payer: Self-pay | Admitting: Emergency Medicine

## 2022-03-07 DIAGNOSIS — M25511 Pain in right shoulder: Secondary | ICD-10-CM | POA: Insufficient documentation

## 2022-03-07 DIAGNOSIS — Z5321 Procedure and treatment not carried out due to patient leaving prior to being seen by health care provider: Secondary | ICD-10-CM | POA: Insufficient documentation

## 2022-03-07 MED ORDER — PREDNISONE 20 MG PO TABS: 60.0000 mg | ORAL_TABLET | Freq: Every day | ORAL | 0 refills | Status: AC

## 2022-03-07 MED ORDER — METHYLPREDNISOLONE SODIUM SUCC 40 MG IJ SOLR: 40.0000 mg | Freq: Once | INTRAMUSCULAR | Status: DC

## 2022-03-07 MED ORDER — METHYLPREDNISOLONE ACETATE 40 MG/ML IJ SUSP: 40.0000 mg | Freq: Once | INTRAMUSCULAR | Status: DC

## 2022-03-07 MED ORDER — LIDOCAINE HCL (PF) 1 % IJ SOLN: 1.0000 mL | Freq: Once | INTRAMUSCULAR | Status: DC

## 2022-03-07 MED ORDER — CYCLOBENZAPRINE HCL 10 MG PO TABS: 10.0000 mg | ORAL_TABLET | Freq: Two times a day (BID) | ORAL | 0 refills | Status: AC | PRN

## 2022-03-07 NOTE — ED Triage Notes (Signed)
Pt called from [facility] to treatment room, no response 

## 2022-03-07 NOTE — ED Triage Notes (Signed)
Pt. Presents to UC w/c/o right shoulder pain for the past 3 days. Pt. States the pain is sharp and radiates down her back. Pt. States she has had no recent injuries or precipitating event to cause her symptoms.

## 2022-03-07 NOTE — ED Triage Notes (Signed)
Pt presents via POV with complaints of R shoulder pain that started 3 days ago. Pt took tylenol and ibuprofen without improvement. She notes last taking the medication yesterday ~2000. Denies falls or  injury.

## 2022-03-07 NOTE — Discharge Instructions (Addendum)
Use Thermacare heating pads.  Follow up by scheduling evaluation with orthopedic urgent care if symptoms do not resolve with treatment or recur.

## 2022-03-07 NOTE — ED Provider Notes (Addendum)
Renaldo Fiddler    CSN: [id number] Arrival date & time: 03/07/22  5038      History   Chief Complaint Chief Complaint  Patient presents with   Shoulder Pain    HPI Lorraine Stevens is a 31 y.o. female.    Shoulder Pain   Presents to urgent care with complaint of right shoulder and neck pain x 3 days.  She endorses sharp pain with movement and states the pain "radiates down my back".  She denies any recent injuries or precipitating events.  Patient states she works 2 jobs and neither of them are physically active.  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  Denies repetitive stress or lifting activities.  She states symptoms are improved with hot shower.  She is not able to tolerate massage due to pain.  States that any movement of her neck or right shoulder is painful.  She presented to the ED yesterday but left before evaluation due to long wait time.  She had x-ray of her shoulder which was unremarkable.  Past Medical History:  Diagnosis Date   Anemia of pregnancy in third trimester 11/27/2017   Medical history non-contributory     Patient Active Problem List   Diagnosis Date Noted   Elevated blood pressure reading in office without diagnosis of hypertension 10/30/2021    Past Surgical History:  Procedure Laterality Date   NO PAST SURGERIES     WISDOM TOOTH EXTRACTION      OB History     Gravida  2   Para  2   Term  2   Preterm      AB      Living  2      SAB      IAB      Ectopic      Multiple  0   Live Births  2            Home Medications    Prior to Admission medications   Medication Sig Start Date End Date Taking? Authorizing Provider  predniSONE (DELTASONE) 20 MG tablet Take 3 tablets (60 mg total) by mouth daily with breakfast for 5 days. 03/07/22 03/12/22 Yes Rishawn Walck, Jeannett Senior, FNP  medroxyPROGESTERone (DEPO-PROVERA) 150 MG/ML injection Inject 150 mg into the muscle every 3 (three)  months.    [provider]  olmesartan (BENICAR) 5 MG tablet Take 1 tablet (5 mg total) by mouth daily. 01/19/22 04/19/22  Lessly Stigler, Jeannett Senior, FNP    Family History Family History  Problem Relation Age of Onset   Hypertension Father    Stroke Maternal Grandmother     Social History Social History   Tobacco Use   Smoking status: Never   Smokeless tobacco: Never  Vaping Use   Vaping Use: Never used  Substance Use Topics   Alcohol use: No   Drug use: No     Allergies   Patient has no known allergies.   Review of Systems Review of Systems   Physical Exam Triage Vital Signs ED Triage Vitals [03/07/22 1009]  Enc Vitals Group     BP 135/86     Pulse Rate 75     Resp 17     Temp 97.9 F (36.6 C)     Temp src      SpO2 95 %     Weight      Height      Head Circumference  Peak Flow      Pain Score 10     Pain Loc      Pain Edu?      Excl. in GC?    No data found.  Updated Vital Signs BP 135/86   Pulse 75   Temp 97.9 F (36.6 C)   Resp 17   LMP  (LMP Unknown)   SpO2 95%   Breastfeeding No   Visual Acuity Right Eye Distance:   Left Eye Distance:   Bilateral Distance:    Right Eye Near:   Left Eye Near:    Bilateral Near:     Physical Exam Vitals reviewed.  Neck:   Musculoskeletal:       Arms:     Comments: There is superficial muscular tenderness along the right trapezius.  No noticeable swelling or deformities.  Neurological:     General: No focal deficit present.     Mental Status: She is alert and oriented to person, place, and time.  Psychiatric:        Mood and Affect: Mood normal.        Behavior: Behavior normal.      UC Treatments / Results  Labs (all labs ordered are listed, but only abnormal results are displayed) Labs Reviewed - No data to display  EKG   Radiology DG Shoulder Right  Result Date: 03/07/2022 CLINICAL DATA:  Right shoulder pain. EXAM: RIGHT SHOULDER - 2+ VIEW COMPARISON:  None Available.  FINDINGS: There is no evidence of fracture or dislocation. There is no evidence of arthropathy or other focal bone abnormality. Soft tissues are unremarkable. IMPRESSION: Negative. Electronically Signed   By: Kennith Center M.D.   On: 03/07/2022 06:32    Procedures Procedures (including critical care time)  Medications Ordered in UC Medications  methylPREDNISolone acetate (DEPO-MEDROL) injection 40 mg (has no administration in time range)  lidocaine (PF) (XYLOCAINE) 1 % injection 1 mL (has no administration in time range)    Initial Impression / Assessment and Plan / UC Course  I have reviewed the triage vital signs and the nursing notes.  Pertinent labs & imaging results that were available during my care of the patient were reviewed by me and considered in my medical decision making (see chart for details).   Exquisite superficial muscular tenderness along the right trapezius.  Patient history and exam is suggestive of muscle spasms/strain.  Agreed to treat patient with muscle relaxant for nighttime use.  Offered injection of Depo-Medrol in clinic but patient declined.  Sending home with prescription for prednisone.  Encourage patient to seek evaluation at orthopedic urgent care if symptoms do not improve or recur.   Final Clinical Impressions(s) / UC Diagnoses   Final diagnoses:  Acute pain of right shoulder     Discharge Instructions      Use [employer] heating pads.  Follow up by scheduling evaluation with orthopedic urgent care if symptoms do not resolve with treatment or recur.      ED Prescriptions     Medication Sig Dispense Auth. Provider   predniSONE (DELTASONE) 20 MG tablet Take 3 tablets (60 mg total) by mouth daily with breakfast for 5 days. 15 tablet Osinachi Navarrette, FNP      PDMP not reviewed this encounter.   Charma Igo, FNP 03/07/22 1037    ImmordinoJeannett Senior, FNP 03/07/22 1039    ImmordinoJeannett Senior, FNP 03/07/22 1041

## 2022-03-07 NOTE — ED Notes (Signed)
Called x2, no answer 

## 2022-03-07 NOTE — ED Notes (Signed)
Pt called x's 3, no response ?

## 2022-08-26 ENCOUNTER — Ambulatory Visit
Admission: RE | Admit: 2022-08-26 | Discharge: 2022-08-26 | Disposition: A | Discharge status: Home / Self Care | Payer: Medicaid Other | Source: Ambulatory Visit | Attending: Urgent Care | Admitting: Urgent Care

## 2022-08-26 VITALS — BP 151/92 | HR 77 | Temp 98.2°F | Resp 16

## 2022-08-26 DIAGNOSIS — L0231 Cutaneous abscess of buttock: Secondary | ICD-10-CM | POA: Diagnosis not present

## 2022-08-26 MED ORDER — SULFAMETHOXAZOLE-TRIMETHOPRIM 800-160 MG PO TABS: 1.0000 | ORAL_TABLET | Freq: Two times a day (BID) | ORAL | 0 refills | Status: AC

## 2022-08-26 NOTE — Discharge Instructions (Addendum)
Follow up here or with your primary care provider if your symptoms are worsening or not improving with treatment.  Take prescribed antibiotics as directed.

## 2022-08-26 NOTE — ED Triage Notes (Signed)
Patient presents to UC for abscess located at her coccyx area x 6 days.

## 2022-08-26 NOTE — ED Provider Notes (Signed)
[facility]    CSN: [id number] Arrival date & time: 08/26/22  1512      History   Chief Complaint Chief Complaint  Patient presents with   Abscess    Ingrown hair Cyst - Entered by patient    HPI Rhyder Grandin is a 32 y.o. female.    Abscess   Patient presents to urgent care with reported abscess located at her coccyx area x 6 days.  Past Medical History:  Diagnosis Date   Anemia of pregnancy in third trimester 11/27/2017   Medical history non-contributory     Patient Active Problem List   Diagnosis Date Noted   Elevated blood pressure reading in office without diagnosis of hypertension 10/30/2021    Past Surgical History:  Procedure Laterality Date   NO PAST SURGERIES     WISDOM TOOTH EXTRACTION      OB History     Gravida  2   Para  2   Term  2   Preterm      AB      Living  2      SAB      IAB      Ectopic      Multiple  0   Live Births  2            Home Medications    Prior to Admission medications   Medication Sig Start Date End Date Taking? Authorizing Provider  cyclobenzaprine (FLEXERIL) 10 MG tablet Take 1 tablet (10 mg total) by mouth 2 (two) times daily as needed for muscle spasms. 03/07/22   Regina Coppolino, Jeannett Senior, FNP  medroxyPROGESTERone (DEPO-PROVERA) 150 MG/ML injection Inject 150 mg into the muscle every 3 (three) months.    [provider]  olmesartan (BENICAR) 5 MG tablet Take 1 tablet (5 mg total) by mouth daily. 01/19/22 04/19/22  Areal Cochrane, Jeannett Senior, FNP    Family History Family History  Problem Relation Age of Onset   Hypertension Father    Stroke Maternal Grandmother     Social History Social History   Tobacco Use   Smoking status: Never   Smokeless tobacco: Never  Vaping Use   Vaping Use: Never used  Substance Use Topics   Alcohol use: No   Drug use: No     Allergies   Patient has no known allergies.   Review of Systems Review of Systems   Physical Exam Triage Vital  Signs ED Triage Vitals [08/26/22 1527]  Enc Vitals Group     BP      Pulse      Resp      Temp      Temp src      SpO2      Weight      Height      Head Circumference      Peak Flow      Pain Score 0     Pain Loc      Pain Edu?      Excl. in GC?    No data found.  Updated Vital Signs There were no vitals taken for this visit.  Visual Acuity Right Eye Distance:   Left Eye Distance:   Bilateral Distance:    Right Eye Near:   Left Eye Near:    Bilateral Near:     Physical Exam Musculoskeletal:       Legs:      UC Treatments / Results  Labs (all labs ordered are listed, but only abnormal  results are displayed) Labs Reviewed - No data to display  EKG   Radiology No results found.  Procedures Incision and Drainage  Date/Time: 08/26/2022 3:58 PM  Performed by: Charma Igo, FNP Authorized by: Charma Igo, FNP   Consent:    Consent obtained:  Verbal   Consent given by:  Patient   Risks discussed:  Bleeding and incomplete drainage   Alternatives discussed:  Delayed treatment and alternative treatment Universal protocol:    Procedure explained and questions answered to patient or proxy's satisfaction: yes     Relevant documents present and verified: yes     Test results available : no     Imaging studies available: no     Required blood products, implants, devices, and special equipment available: no     Site/side marked: no     Immediately prior to procedure, a time out was called: yes     Patient identity confirmed:  Verbally with patient Location:    Type:  Abscess   Size:  2 cm   Location:  Anogenital   Anogenital location:  Gluteal cleft Pre-procedure details:    Skin preparation:  Povidone-iodine Sedation:    Sedation type:  None Anesthesia:    Anesthesia method:  Local infiltration   Local anesthetic:  Lidocaine 1% w/o epi Procedure type:    Complexity:  Simple Procedure details:    Ultrasound guidance: no     Needle  aspiration: no     Incision types:  Stab incision   Incision depth:  Dermal   Wound management:  Probed and deloculated   Drainage:  Purulent   Drainage amount:  Scant   Wound treatment:  Wound left open   Packing materials:  None Post-procedure details:    Procedure completion:  Tolerated with difficulty Comments:     Patient given instructions to soak in warm water with Epsom salts.  (including critical care time)  Medications Ordered in UC Medications - No data to display  Initial Impression / Assessment and Plan / UC Course  I have reviewed the triage vital signs and the nursing notes.  Pertinent labs & imaging results that were available during my care of the patient were reviewed by me and considered in my medical decision making (see chart for details).   Gluteal abscess.  See procedure note.  Prescribing Bactrim for antibiotic therapy.  Counseled patient on potential for adverse effects with medications prescribed/recommended today, ER and return-to-clinic precautions discussed, patient verbalized understanding and agreement with care plan.  Final Clinical Impressions(s) / UC Diagnoses   Final diagnoses:  None   Discharge Instructions   None    ED Prescriptions   None    PDMP not reviewed this encounter.   Charma Igo, FNP 08/26/22 1600

## 2023-07-16 ENCOUNTER — Ambulatory Visit (INDEPENDENT_AMBULATORY_CARE_PROVIDER_SITE_OTHER)

## 2023-07-16 ENCOUNTER — Ambulatory Visit
Admission: EM | Admit: 2023-07-16 | Discharge: 2023-07-16 | Disposition: A | Discharge status: Home / Self Care | Attending: Emergency Medicine | Admitting: Emergency Medicine

## 2023-07-16 DIAGNOSIS — M25571 Pain in right ankle and joints of right foot: Secondary | ICD-10-CM | POA: Diagnosis not present

## 2023-07-16 DIAGNOSIS — L02419 Cutaneous abscess of limb, unspecified: Secondary | ICD-10-CM

## 2023-07-16 MED ORDER — SULFAMETHOXAZOLE-TRIMETHOPRIM 800-160 MG PO TABS: 1.0000 | ORAL_TABLET | Freq: Two times a day (BID) | ORAL | 0 refills | Status: AC

## 2023-07-16 NOTE — ED Triage Notes (Addendum)
 Patient to Urgent Care with complaints of  multiple abscesses present to right armpit (feels like the area fluctuates in size), difficult to move arm up and down. Symptoms x1 month. No drainage. Using hot compresses.   Also w/ complaints of right sided ankle pain that started yesterday. Repots she had some swelling that started yesterday. Denies any known injury. Has some discoloration and swelling x1 month.

## 2023-07-16 NOTE — Discharge Instructions (Addendum)
 Take the Bactrim  as directed for your abscesses.  Continue warm compresses.  Follow up with a dermatologist such as the one listed below.    The xray of your ankle is pending.  Wear the walking boot.  Take ibuprofen  as directed.  Rest and elevate your ankle.  Follow up with an orthopedist if your symptoms are not improving.

## 2023-07-16 NOTE — ED Provider Notes (Signed)
 Lorraine Stevens    CSN: [id number] Arrival date & time: 07/16/23  0820      History   Chief Complaint Chief Complaint  Patient presents with   Abscess    HPI Lorraine Stevens is a 33 y.o. female.  Patient presents with multiple abscesses in her right axilla x 1 month.  She states they have decreased in size.  She has been applying warm compresses.  She denies fever, chills, open wounds, drainage.  Patient also presents with right ankle pain x 1 day.  She noted bruising of her anterior ankle x 1 month.  She states it was swollen yesterday but this has improved.  No OTC medications taken.  The history is provided by the patient and medical records.    Past Medical History:  Diagnosis Date   Anemia of pregnancy in third trimester 11/27/2017   Medical history non-contributory     Patient Active Problem List   Diagnosis Date Noted   Elevated blood pressure reading in office without diagnosis of hypertension 10/30/2021    Past Surgical History:  Procedure Laterality Date   NO PAST SURGERIES     WISDOM TOOTH EXTRACTION      OB History     Gravida  2   Para  2   Term  2   Preterm      AB      Living  2      SAB      IAB      Ectopic      Multiple  0   Live Births  2            Home Medications    Prior to Admission medications   Medication Sig Start Date End Date Taking? Authorizing Provider  sulfamethoxazole -trimethoprim  (BACTRIM  DS) 800-160 MG tablet Take 1 tablet by mouth 2 (two) times daily for 7 days. 07/16/23 07/23/23 Yes Wellington Half, NP  cyclobenzaprine  (FLEXERIL ) 10 MG tablet Take 1 tablet (10 mg total) by mouth 2 (two) times daily as needed for muscle spasms. Patient not taking: Reported on 07/16/2023 03/07/22   Immordino, Mara [place]in[place], FNP  medroxyPROGESTERone (DEPO-PROVERA) 150 MG/ML injection Inject 150 mg into the muscle every 3 (three) months.    [provider]  olmesartan  (BENICAR ) 5 MG tablet Take 1 tablet (5 mg total) by  mouth daily. Patient not taking: Reported on 07/16/2023 01/19/22 04/19/22  Immordino, Mara [place]in[place], FNP    Family History Family History  Problem Relation Age of Onset   Hypertension Father    Stroke Maternal Grandmother     Social History Social History   Tobacco Use   Smoking status: Never   Smokeless tobacco: Never  Vaping Use   Vaping status: Never Used  Substance Use Topics   Alcohol use: No   Drug use: No     Allergies   Patient has no known allergies.   Review of Systems Review of Systems  Constitutional:  Negative for chills and fever.  Musculoskeletal:  Positive for arthralgias and joint swelling. Negative for gait problem.  Skin:  Negative for color change and wound.  Neurological:  Negative for weakness and numbness.     Physical Exam Triage Vital Signs ED Triage Vitals  Encounter Vitals Group     BP      Systolic BP Percentile      Diastolic BP Percentile      Pulse      Resp      Temp  Temp src      SpO2      Weight      Height      Head Circumference      Peak Flow      Pain Score      Pain Loc      Pain Education      Exclude from Growth Chart    No data found.  Updated Vital Signs BP 136/76   Pulse 81   Temp 97.8 F (36.6 C)   Resp 18   SpO2 97%   Visual Acuity Right Eye Distance:   Left Eye Distance:   Bilateral Distance:    Right Eye Near:   Left Eye Near:    Bilateral Near:     Physical Exam Constitutional:      General: She is not in acute distress. HENT:     Mouth/Throat:     Mouth: Mucous membranes are moist.  Cardiovascular:     Rate and Rhythm: Normal rate and regular rhythm.  Pulmonary:     Effort: Pulmonary effort is normal. No respiratory distress.  Musculoskeletal:        General: Tenderness present. No swelling or deformity. Normal range of motion.     Comments: Right anterior ankle slightly tender to palpation in areas that are ecchymotic.  No wounds or redness.  FROM, sensation intact, strength 5/5,  2+ pedal pulse.    Skin:    General: Skin is warm and dry.     Capillary Refill: Capillary refill takes less than 2 seconds.     Findings: Bruising present. No erythema, lesion or rash.     Comments: Two tender nonfluctuant abscesses in right axilla. No erythema, wounds, drainage.    Neurological:     General: No focal deficit present.     Mental Status: She is alert and oriented to person, place, and time.     Sensory: No sensory deficit.     Motor: No weakness.     Gait: Gait normal.      UC Treatments / Results  Labs (all labs ordered are listed, but only abnormal results are displayed) Labs Reviewed - No data to display  EKG   Radiology DG Ankle Complete Right Result Date: 07/16/2023 CLINICAL DATA:  Pain and bruising. EXAM: RIGHT ANKLE - COMPLETE 3 VIEW COMPARISON:  None Available. FINDINGS: There is no evidence of fracture, dislocation, or joint effusion. There is no evidence of arthropathy or other focal bone abnormality. Soft tissues are unremarkable. IMPRESSION: Negative. Electronically Signed   By: Sydell Eva M.D.   On: 07/16/2023 09:17    Procedures Procedures (including critical care time)  Medications Ordered in UC Medications - No data to display  Initial Impression / Assessment and Plan / UC Course  I have reviewed the triage vital signs and the nursing notes.  Pertinent labs & imaging results that were available during my care of the patient were reviewed by me and considered in my medical decision making (see chart for details).    Abscesses in right axilla, Right ankle pain.   Afebrile and vital signs are stable.  The abscesses are not ready to be drained.  Treating today with Bactrim  and continued warm compresses.  Instructed patient to follow-up with a dermatologist.  Return precautions discussed.  Education provided on abscess.  X-ray of right ankle negative.  Treating with walking boot, ibuprofen , rest, elevation.  Instructed patient to follow-up  with an orthopedist.  Contact information for on-call Ortho  provided.  Education provided on ankle pain.  She agrees to plan of care.  Final Clinical Impressions(s) / UC Diagnoses   Final diagnoses:  Acute right ankle pain  Abscess of axillary region     Discharge Instructions      Take the Bactrim  as directed for your abscesses.  Continue warm compresses.  Follow up with a dermatologist such as the one listed below.    The xray of your ankle is pending.  Wear the walking boot.  Take ibuprofen  as directed.  Rest and elevate your ankle.  Follow up with an orthopedist if your symptoms are not improving.        ED Prescriptions     Medication Sig Dispense Auth. Provider   sulfamethoxazole -trimethoprim  (BACTRIM  DS) 800-160 MG tablet Take 1 tablet by mouth 2 (two) times daily for 7 days. 14 tablet Wellington Half, NP      PDMP not reviewed this encounter.   Wellington Half, NP 07/16/23 [phone]

## 2023-08-01 IMAGING — CR DG CHEST 2V
1 series · 2 of 2 positions shown · non-contrast
Comparison: Chest radiographs 02/11/2018 and earlier.

CLINICAL DATA: 29-year-old female with shortness of breath, body
ache, fever. Daughter recently positive for influenza.

EXAM:
CHEST - 2 VIEW

[Series 1: dg chest 2 view · 0.14mm/px · 2 of 2 slices shown]
[im 1/2]
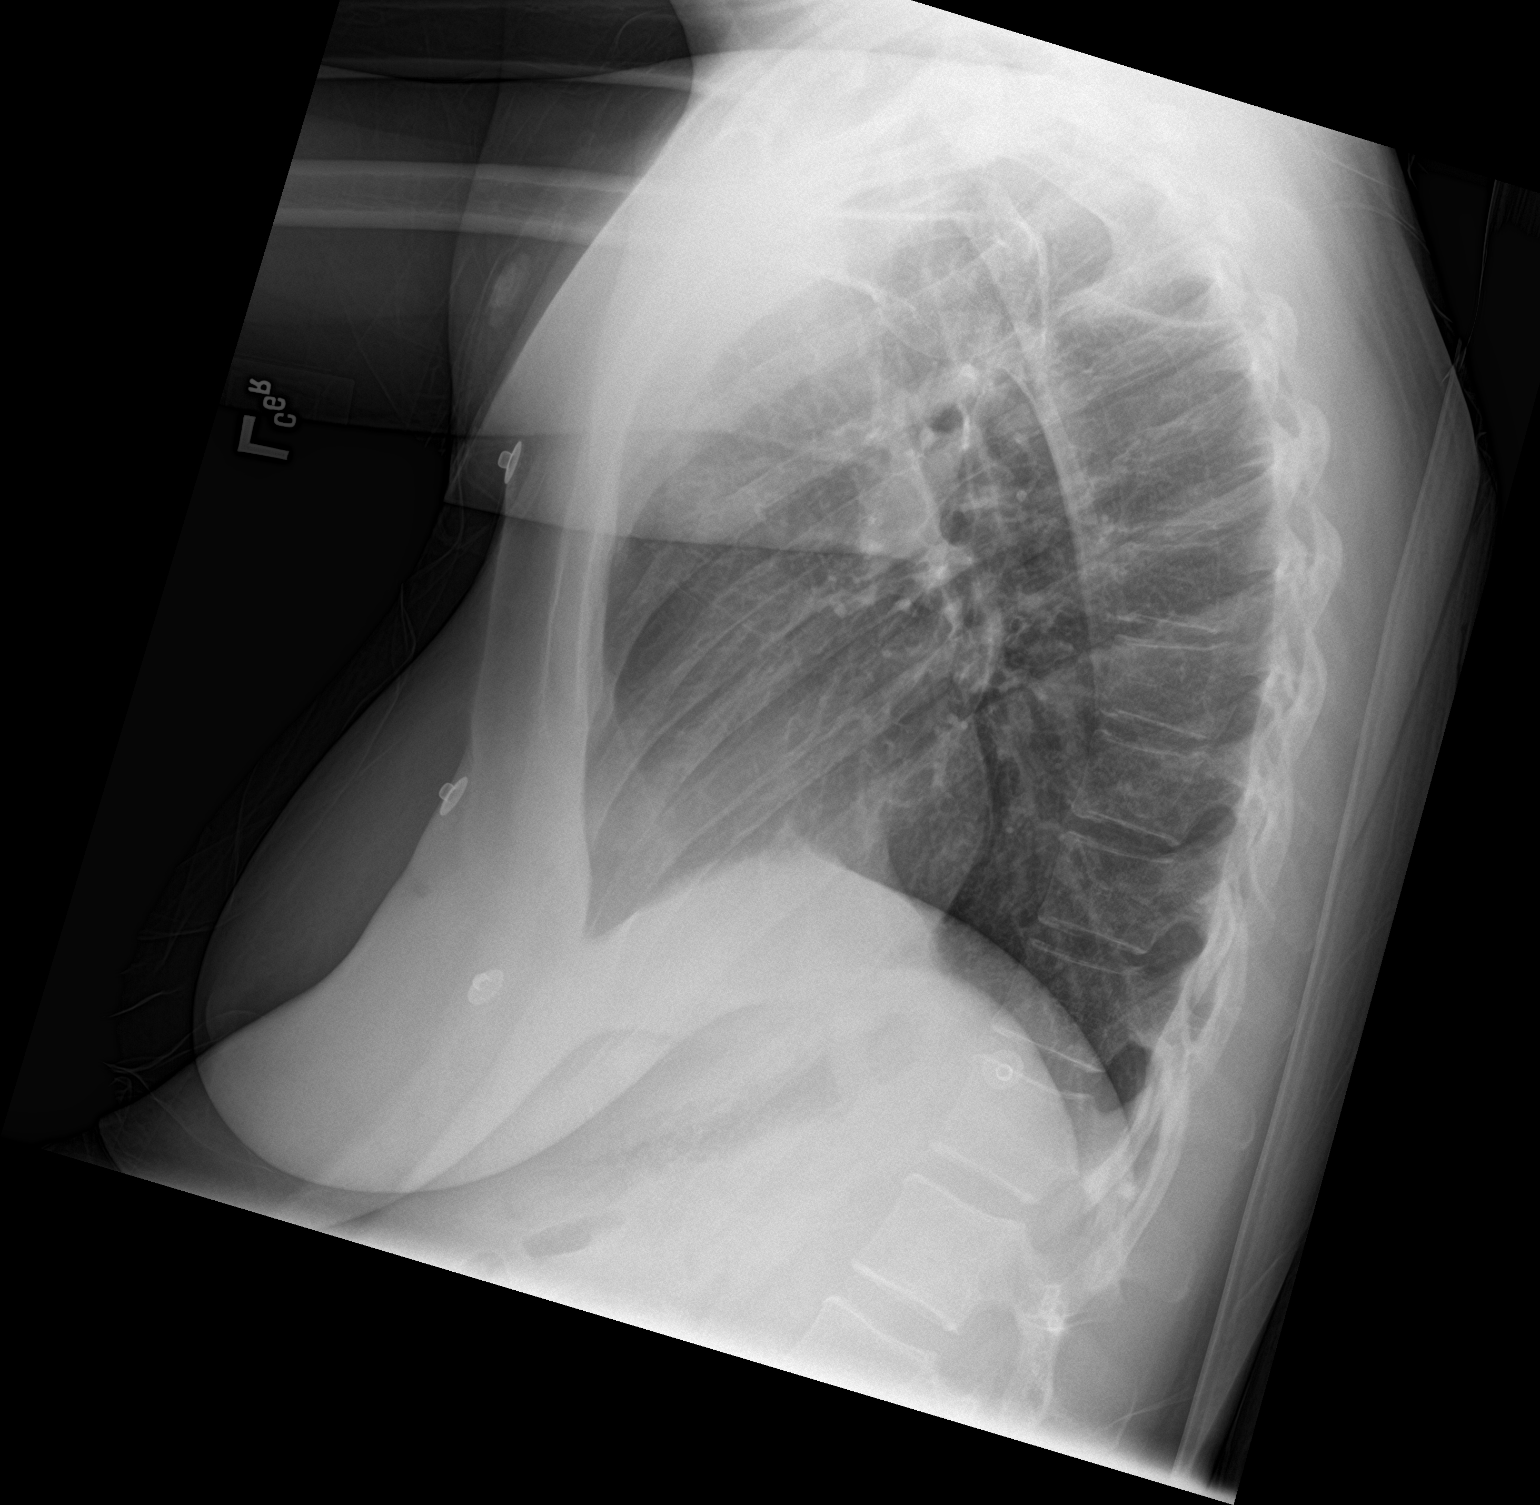
[im 2/2]
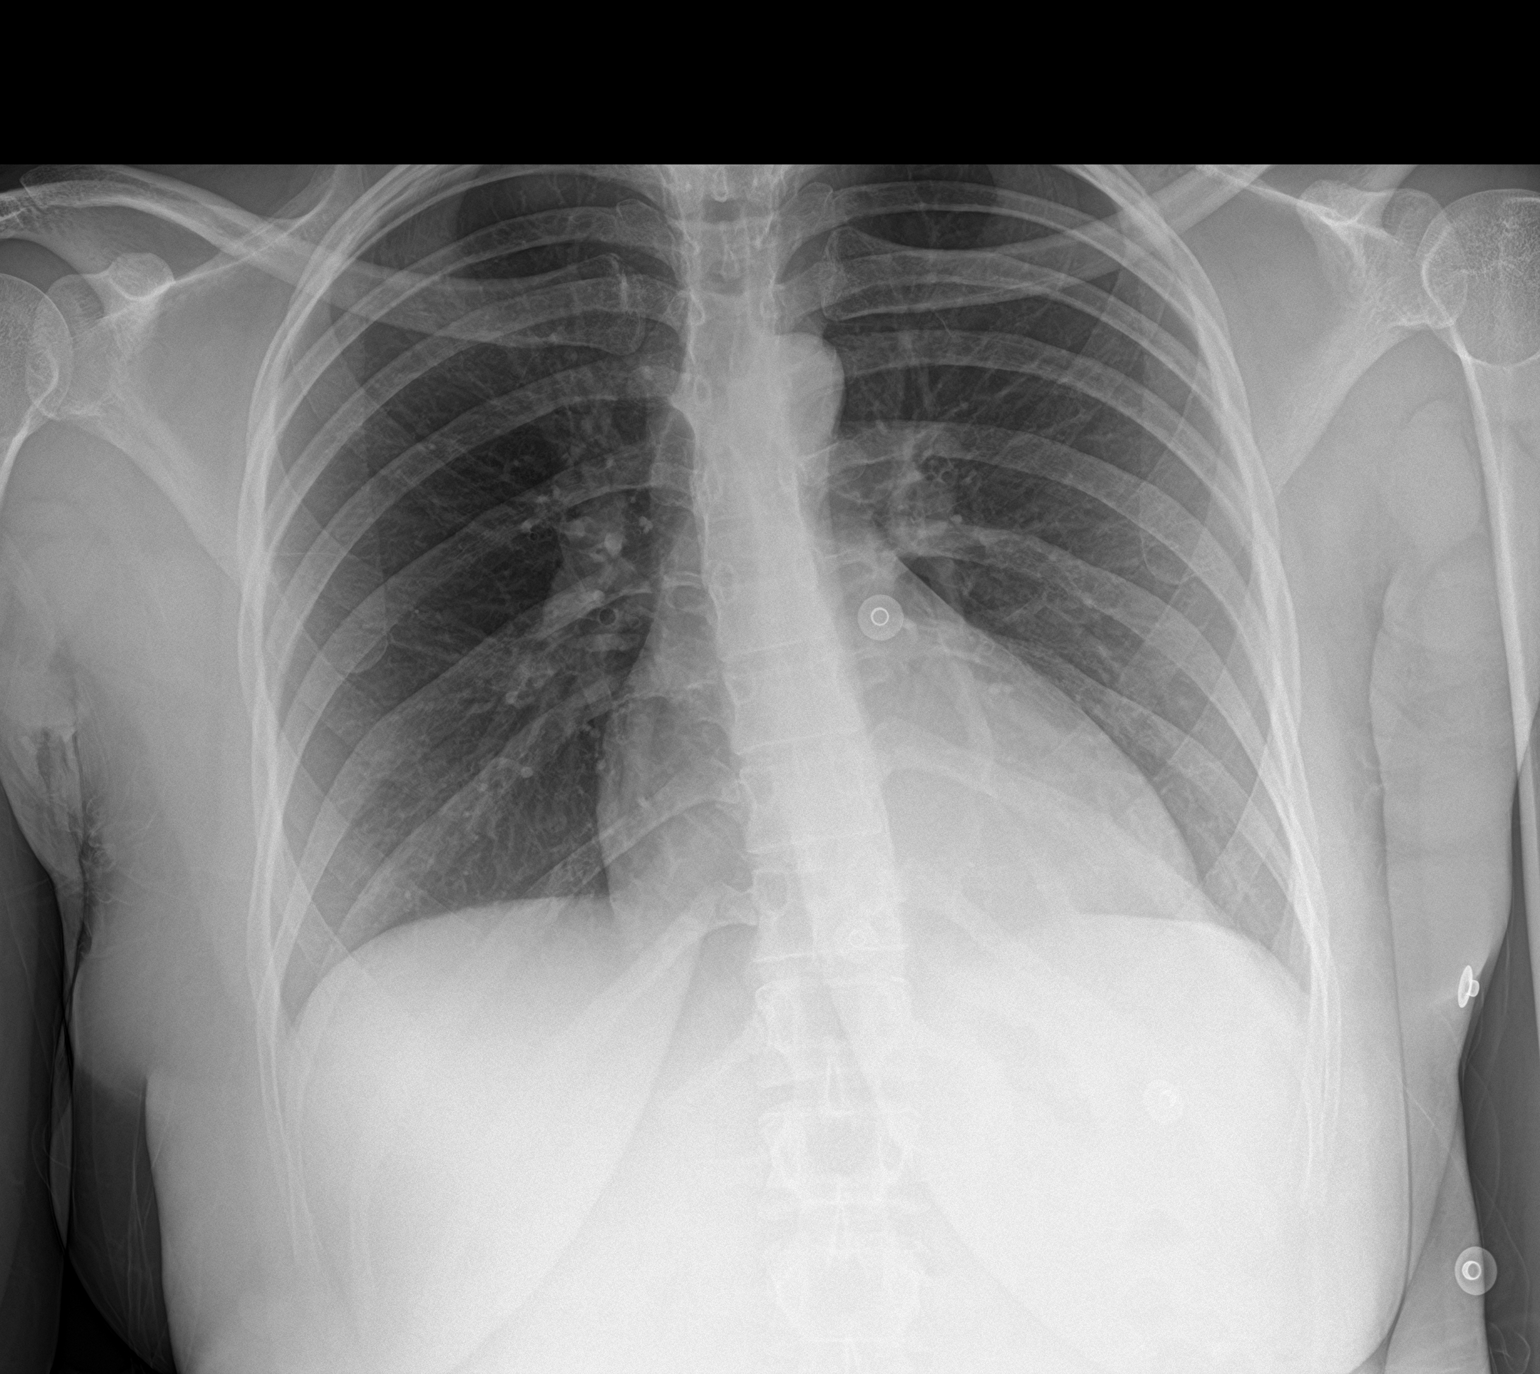

[2 of 2 positions shown; findings below may reference images not displayed]

FINDINGS: Seated AP and lateral views of the chest. Stable somewhat low lung
volumes over this series of exams. Normal cardiac size and
mediastinal contours. Visualized tracheal air column is within
normal limits. Lung markings appear stable and within normal limits.
Both lungs appear clear. No pneumothorax or pleural effusion. Mild
thoracolumbar scoliosis. No acute osseous abnormality identified.
Negative visible bowel gas.
IMPRESSION: No cardiopulmonary abnormality.  Mild scoliosis.

## 2023-09-19 ENCOUNTER — Emergency Department

## 2023-09-19 ENCOUNTER — Other Ambulatory Visit: Payer: Self-pay

## 2023-09-19 ENCOUNTER — Emergency Department
Admission: EM | Admit: 2023-09-19 | Discharge: 2023-09-19 | Disposition: A | Discharge status: Home / Self Care | Attending: Emergency Medicine | Admitting: Emergency Medicine

## 2023-09-19 DIAGNOSIS — R519 Headache, unspecified: Secondary | ICD-10-CM | POA: Insufficient documentation

## 2023-09-19 DIAGNOSIS — I10 Essential (primary) hypertension: Secondary | ICD-10-CM | POA: Diagnosis not present

## 2023-09-19 LAB — POC URINE PREG, ED: Preg Test, Ur: NEGATIVE

## 2023-09-19 MED ORDER — KETOROLAC TROMETHAMINE 15 MG/ML IJ SOLN
15.0000 mg | Freq: Once | INTRAMUSCULAR | Status: AC
  Administered 2023-09-19: 15 mg via INTRAVENOUS
  Filled 2023-09-19: qty 1

## 2023-09-19 MED ORDER — SODIUM CHLORIDE 0.9 % IV BOLUS
1000.0000 mL | Freq: Once | INTRAVENOUS | Status: AC
  Administered 2023-09-19: 1000 mL via INTRAVENOUS

## 2023-09-19 MED ORDER — METOCLOPRAMIDE HCL 5 MG/ML IJ SOLN
10.0000 mg | Freq: Once | INTRAMUSCULAR | Status: AC
  Administered 2023-09-19: 10 mg via INTRAVENOUS
  Filled 2023-09-19: qty 2

## 2023-09-19 NOTE — ED Provider Notes (Signed)
 [facility] Provider Note    None    (approximate)   History   Migraine   HPI  Lorraine Stevens is a 33 y.o. female who presents today for evaluation of headache for the past 4 days.  Patient reports that her headache began gradually after spending outside in the sun, and steadily worsened throughout the weekend.  No nausea or vomiting.  No visual changes.  No neck pain.  She reports that her headache is in the back of her head and across her forehead.  No numbness or tingling in her arms or legs.  No speech difficulty.  No difficulty walking.  She is not anticoagulated.  Patient Active Problem List   Diagnosis Date Noted   Elevated blood pressure reading in office without diagnosis of hypertension 10/30/2021          Physical Exam   Triage Vital Signs: ED Triage Vitals  Encounter Vitals Group     BP 09/19/23 0832 (!) 148/101     Girls Systolic BP Percentile --      Girls Diastolic BP Percentile --      Boys Systolic BP Percentile --      Boys Diastolic BP Percentile --      Pulse Rate 09/19/23 0832 77     Resp 09/19/23 0832 18     Temp 09/19/23 0832 98.4 F (36.9 C)     Temp Source 09/19/23 0832 Oral     SpO2 09/19/23 0832 98 %     Weight 09/19/23 0831 170 lb (77.1 kg)     Height 09/19/23 0831 5' 8 (1.727 m)     Head Circumference --      Peak Flow --      Pain Score 09/19/23 0830 10     Pain Loc --      Pain Education --      Exclude from Growth Chart --     Most recent vital signs: Vitals:   09/19/23 0832  BP: (!) 148/101  Pulse: 77  Resp: 18  Temp: 98.4 F (36.9 C)  SpO2: 98%    Physical Exam Vitals and nursing note reviewed.  Constitutional:      General: Awake and alert. No acute distress.    Appearance: Normal appearance. The patient is normal weight.  HENT:     Head: Normocephalic and atraumatic.     Mouth: Mucous membranes are moist.  Eyes:     General: PERRL. Normal EOMs        Right eye: No discharge.         Left eye: No discharge.     Conjunctiva/sclera: Conjunctivae normal.  Cardiovascular:     Rate and Rhythm: Normal rate and regular rhythm.     Pulses: Normal pulses.  Pulmonary:     Effort: Pulmonary effort is normal. No respiratory distress.     Breath sounds: Normal breath sounds.  Abdominal:     Abdomen is soft. There is no abdominal tenderness. No rebound or guarding. No distention. Musculoskeletal:        General: No swelling. Normal range of motion.     Cervical back: Normal range of motion and neck supple.  Skin:    General: Skin is warm and dry.     Capillary Refill: Capillary refill takes less than 2 seconds.     Findings: No rash.  Neurological:     Mental Status: The patient is awake and alert.   Neurological: GCS 15 alert and  oriented x3 Normal speech, no expressive or receptive aphasia or dysarthria Cranial nerves II through XII intact Normal visual fields 5 out of 5 strength in all 4 extremities with intact sensation throughout No extremity drift Normal finger-to-nose testing, no limb or truncal ataxia   ED Results / Procedures / Treatments   Labs (all labs ordered are listed, but only abnormal results are displayed) Labs Reviewed  POC URINE PREG, ED     EKG     RADIOLOGY I independently reviewed and interpreted imaging and agree with radiologists findings.     PROCEDURES:  Critical Care performed:   Procedures   MEDICATIONS ORDERED IN ED: Medications  ketorolac (TORADOL) 15 MG/ML injection 15 mg (15 mg Intravenous Given 09/19/23 0855)  metoCLOPramide  (REGLAN ) injection 10 mg (10 mg Intravenous Given 09/19/23 0855)  sodium chloride  0.9 % bolus 1,000 mL (0 mLs Intravenous Stopped 09/19/23 1015)     IMPRESSION / MDM / ASSESSMENT AND PLAN / ED COURSE  I reviewed the triage vital signs and the nursing notes.   Differential diagnosis includes, but is not limited to, migraine headache, tension headache, cluster headache, dehydration.  Patient  is awake and alert, hemodynamically stable and afebrile.  She is nontoxic in appearance and has no focal neurological deficits.  Patient presented with a chief concern of a headache. Gradual in onset, without history or physical exam findings to suggest encephalopathy; no altered mental status, fever or meningismus, vision changes, vomiting or focal neurological deficit and improved with treatment in the emergency department. Therefore, I have low suspicion for concerning process that would require urgent or emergent diagnostic/therapeutic procedural intervention such as lumbar puncture. Doubt meningitis as there is no fever, photophobia, neck symptoms, altered mental status. Additionally the patient is not known to be immunocompromised. No history of trauma, doubt subdural or epidural hematoma. No dizziness or other neurologic symptoms so cerebellar infarction or other hemorrhagic stroke are unlikely. Intracranial mass unlikely given that the headache is not getting progressively worse, is not worse in the morning, there are no other neurologic symptoms, and the neurologic exam is grossly normal. Unlikely to be giant cell arteritis as there is no tenderness over temporal artery or vision changes. Doubt CO toxicity as no known exposure and no other family members have a headache. No neck pain and was not sudden onset or associated with movement of the neck and no dizziness,  doubt carotid artery dissection. No occipital tenderness so occipital neuralgia seems less likely.  Given that patient has not had imaging of her head before, CT head obtained which was negative for any acute findings.  Upon reevaluation, patient reports her headache is nearly resolved.  Return precautions discussed, patient to follow-up closely with outpatient provider.  Patient was discharged in stable condition.    Patient's presentation is most consistent with acute presentation with potential threat to life or bodily  function.  Clinical Course as of 09/19/23 1254  Sun Sep 19, 2023  0935 Patient reports that she feels significantly improved [JP]    Clinical Course User Index [JP] Balian Schaller E, PA-C     FINAL CLINICAL IMPRESSION(S) / ED DIAGNOSES   Final diagnoses:  Acute nonintractable headache, unspecified headache type     Rx / DC Orders   ED Discharge Orders     None        Note:  This document was prepared using Dragon voice recognition software and may include unintentional dictation errors.   Lacheryl Niesen E, PA-C 09/19/23 1254  Willo Dunnings, MD 09/19/23 1535

## 2023-09-19 NOTE — Discharge Instructions (Signed)
 Your CT scan was normal.  Please schedule a follow-up appointment with neurology if your headaches persist or recur.  Please return for any new, worsening, or change in symptoms or other concerns. It was a pleasure caring for you today.

## 2023-09-19 NOTE — ED Triage Notes (Signed)
 Pt sts that she has been having a headache for the last three days. Pt sts that she has taken multiple OTC meds and nothing is working.

## 2024-02-11 ENCOUNTER — Emergency Department
Admission: EM | Admit: 2024-02-11 | Discharge: 2024-02-11 | Disposition: A | Discharge status: Home / Self Care | Attending: Emergency Medicine | Admitting: Emergency Medicine

## 2024-02-11 DIAGNOSIS — I10 Essential (primary) hypertension: Secondary | ICD-10-CM | POA: Diagnosis not present

## 2024-02-11 DIAGNOSIS — R030 Elevated blood-pressure reading, without diagnosis of hypertension: Secondary | ICD-10-CM | POA: Diagnosis present

## 2024-02-11 LAB — CBC WITH DIFFERENTIAL/PLATELET
Abs Immature Granulocytes: 0.02 K/uL (ref 0.00–0.07)
Basophils Absolute: 0 K/uL (ref 0.0–0.1)
Basophils Relative: 1 %
Eosinophils Absolute: 0.1 K/uL (ref 0.0–0.5)
Eosinophils Relative: 2 %
HCT: 36.6 % (ref 36.0–46.0)
Hemoglobin: 12.5 g/dL (ref 12.0–15.0)
Immature Granulocytes: 0 %
Lymphocytes Relative: 38 %
Lymphs Abs: 3 K/uL (ref 0.7–4.0)
MCH: 31.6 pg (ref 26.0–34.0)
MCHC: 34.2 g/dL (ref 30.0–36.0)
MCV: 92.4 fL (ref 80.0–100.0)
Monocytes Absolute: 0.3 K/uL (ref 0.1–1.0)
Monocytes Relative: 4 %
Neutro Abs: 4.3 K/uL (ref 1.7–7.7)
Neutrophils Relative %: 55 %
Platelets: 150 K/uL (ref 150–400)
RBC: 3.96 MIL/uL (ref 3.87–5.11)
RDW: 12.9 % (ref 11.5–15.5)
WBC: 7.8 K/uL (ref 4.0–10.5)
nRBC: 0 % (ref 0.0–0.2)

## 2024-02-11 LAB — BASIC METABOLIC PANEL WITH GFR
Anion gap: 9 (ref 5–15)
BUN: 11 mg/dL (ref 6–20)
CO2: 23 mmol/L (ref 22–32)
Calcium: 9.3 mg/dL (ref 8.9–10.3)
Chloride: 107 mmol/L (ref 98–111)
Creatinine, Ser: 1.02 mg/dL — ABNORMAL HIGH (ref 0.44–1.00)
GFR, Estimated: >60 mL/min (ref >60)
Glucose, Bld: 102 mg/dL — ABNORMAL HIGH (ref 70–99)
Potassium: 3.6 mmol/L (ref 3.5–5.1)
Sodium: 139 mmol/L (ref 135–145)

## 2024-02-11 MED ORDER — AMLODIPINE BESYLATE 5 MG PO TABS: 5.0000 mg | ORAL_TABLET | Freq: Every day | ORAL | 2 refills | Status: AC

## 2024-02-11 NOTE — ED Provider Notes (Signed)
 [facility] Provider Note    Event Date/Time   First MD Initiated Contact with Patient 02/11/24 2023     (approximate)   History   Hypertension   HPI  Lorraine Stevens is a 33 y.o. female who presents with elevated blood pressure.  Patient reports that she was about to get a dental procedure but her blood pressure was over 200 systolic so they would not do the procedure.  She reports no complaints, no chest pain, no headache.  She reports in the past she has had an elevated blood pressure as well but she is not on any blood pressure medication.     Physical Exam   Triage Vital Signs: ED Triage Vitals  Encounter Vitals Group     BP 02/11/24 1850 (!) 159/107     Girls Systolic BP Percentile --      Girls Diastolic BP Percentile --      Boys Systolic BP Percentile --      Boys Diastolic BP Percentile --      Pulse Rate 02/11/24 1850 66     Resp 02/11/24 1850 20     Temp 02/11/24 1850 98.7 F (37.1 C)     Temp Source 02/11/24 1850 Oral     SpO2 02/11/24 1850 100 %     Weight 02/11/24 1852 74.8 kg (165 lb)     Height 02/11/24 1852 1.676 m (5' 6)     Head Circumference --      Peak Flow --      Pain Score 02/11/24 1851 8     Pain Loc --      Pain Education --      Exclude from Growth Chart --     Most recent vital signs: Vitals:   02/11/24 1850  BP: (!) 159/107  Pulse: 66  Resp: 20  Temp: 98.7 F (37.1 C)  SpO2: 100%     General: Awake, no distress.  CV:  Good peripheral perfusion.  Regular rate and rhythm Resp:  Normal effort.  Clear to auscultation bilaterally Abd:  No distention.  Other:  No lower extremity edema   ED Results / Procedures / Treatments   Labs (all labs ordered are listed, but only abnormal results are displayed) Labs Reviewed  BASIC METABOLIC PANEL WITH GFR - Abnormal; Notable for the following components:      Result Value   Glucose, Bld 102 (*)    Creatinine, Ser 1.02 (*)    All other components within  normal limits  CBC WITH DIFFERENTIAL/PLATELET     EKG     RADIOLOGY     PROCEDURES:  Critical Care performed:   Procedures   MEDICATIONS ORDERED IN ED: Medications - No data to display   IMPRESSION / MDM / ASSESSMENT AND PLAN / ED COURSE  I reviewed the triage vital signs and the nursing notes. Patient's presentation is most consistent with exacerbation of chronic illness.  Patient presents with elevated blood pressure as detailed above, found to be hypertensive here as well although not quite as high as at her dental office.  She is overall asymptomatic.  She does report prior elevated blood pressure reading  Lab work is reassuring.  Discussed with her that we will start her on amlodipine she is on birth control.  Have placed PCP referral as well for titration        FINAL CLINICAL IMPRESSION(S) / ED DIAGNOSES   Final diagnoses:  Uncontrolled hypertension  Rx / DC Orders   ED Discharge Orders          Ordered    amLODipine (NORVASC) 5 MG tablet  Daily        02/11/24 2035    Ambulatory Referral to Primary Care (Establish Care)        02/11/24 2035             Note:  This document was prepared using Dragon voice recognition software and may include unintentional dictation errors.   Arlander Charleston, MD 02/11/24 2251

## 2024-02-11 NOTE — ED Triage Notes (Signed)
 Pt to ED, was supposed to have dental procedure but BP was high: 225/110. Does not take meds for HTN, states has had readings like 150/90 but not high like today. Endorses mildly blurry vision and HA today.
